# Patient Record
Sex: Male | Born: 1985 | Race: Black or African American | Hispanic: No | Marital: Single | Smoking: Current every day smoker
Health system: Southern US, Community
[De-identification: ages and names within clinical notes are randomized; demographics above are authoritative.]

## PROBLEM LIST (undated history)

## (undated) DIAGNOSIS — R7989 Other specified abnormal findings of blood chemistry: Secondary | ICD-10-CM

## (undated) DIAGNOSIS — Z720 Tobacco use: Secondary | ICD-10-CM

## (undated) DIAGNOSIS — M25569 Pain in unspecified knee: Secondary | ICD-10-CM

## (undated) DIAGNOSIS — I214 Non-ST elevation (NSTEMI) myocardial infarction: Secondary | ICD-10-CM

## (undated) DIAGNOSIS — I471 Supraventricular tachycardia, unspecified: Secondary | ICD-10-CM

## (undated) DIAGNOSIS — R079 Chest pain, unspecified: Secondary | ICD-10-CM

## (undated) DIAGNOSIS — R002 Palpitations: Secondary | ICD-10-CM

## (undated) HISTORY — PX: KNEE SURGERY: SHX244

---
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
## 2007-03-30 ENCOUNTER — Ambulatory Visit: Payer: Self-pay | Admitting: Orthopedic Surgery

## 2008-02-05 ENCOUNTER — Emergency Department (HOSPITAL_COMMUNITY): Admission: EM | Admit: 2008-02-05 | Discharge: 2008-02-05 | Payer: Self-pay | Admitting: Emergency Medicine

## 2008-02-17 ENCOUNTER — Emergency Department (HOSPITAL_COMMUNITY): Admission: EM | Admit: 2008-02-17 | Discharge: 2008-02-17 | Payer: Self-pay | Admitting: Emergency Medicine

## 2008-05-27 ENCOUNTER — Emergency Department (HOSPITAL_COMMUNITY): Admission: EM | Admit: 2008-05-27 | Discharge: 2008-05-27 | Payer: Self-pay | Admitting: Emergency Medicine

## 2008-10-30 ENCOUNTER — Encounter: Payer: Self-pay | Admitting: Orthopedic Surgery

## 2008-10-30 ENCOUNTER — Ambulatory Visit (HOSPITAL_COMMUNITY): Admission: RE | Admit: 2008-10-30 | Discharge: 2008-10-30 | Payer: Self-pay | Admitting: Family Medicine

## 2008-12-02 ENCOUNTER — Ambulatory Visit: Payer: Self-pay | Admitting: Orthopedic Surgery

## 2008-12-02 DIAGNOSIS — M25569 Pain in unspecified knee: Secondary | ICD-10-CM

## 2008-12-02 HISTORY — DX: Pain in unspecified knee: M25.569

## 2008-12-03 ENCOUNTER — Telehealth: Payer: Self-pay | Admitting: Orthopedic Surgery

## 2009-05-01 ENCOUNTER — Ambulatory Visit (HOSPITAL_COMMUNITY): Admission: RE | Admit: 2009-05-01 | Discharge: 2009-05-01 | Payer: Self-pay | Admitting: Orthopaedic Surgery

## 2009-05-13 ENCOUNTER — Encounter (HOSPITAL_COMMUNITY): Admission: RE | Admit: 2009-05-13 | Discharge: 2009-06-12 | Payer: Self-pay | Admitting: Orthopaedic Surgery

## 2010-12-04 NOTE — Op Note (Signed)
NAME:  Alan Curtis NO.:  [id number]   MEDICAL RECORD NO.:  [id number]          PATIENT TYPE:  AMB   LOCATION:  DAY                           FACILITY:  [facility]   PHYSICIAN:  Vickki Hearing, M.D.DATE OF BIRTH:  [birth year]   DATE OF PROCEDURE:  06/28/2006  DATE OF DISCHARGE:                               OPERATIVE REPORT   HISTORY:  Alan Curtis was injured in October 2007 playing basketball.  This was a non-contact injury.  The initial evaluation was done by Dr.  Hilda Lias.  An MRI was done. The patient had an anterior cruciate ligament  tear.  Since Dr. Hilda Lias does not do reconstructions, he was referred to  me for consultation through his primary care physician, Dr. Sudie Bailey.  His primary complaint was that his knee does not feel right. Physical  exam confirmed his Lachman test as positive. His MRI confirmed him to  have an isolated ACL tear with bone bruises and, based on his age and  activity level, it was recommended that he have an ACL reconstruction.  He gave informed consent for the same and we scheduled the surgery.   PREOPERATIVE DIAGNOSIS:  Anterior cruciate ligament tear, right knee.   POSTOPERATIVE DIAGNOSIS:  Anterior cruciate ligament tear, right knee.   PROCEDURE:  Anterior cruciate ligament reconstruction with a patellar  tendon autograft.   IMPLANTS USED:  Two bio-absorbable screws, 7 x 23 and 11 x 28.   SURGEON:  Vickki Hearing, M.D.   ASSISTANTSherrill Raring.   ANESTHESIA:  General.   OPERATIVE FINDINGS:  Under anesthesia, he had full range of motion. The  collateral ligaments were stable.  PCL was normal.  ACL was torn  evidenced by a positive Lachman. Grade of Lachman test was 2.  The pivot  shift was positive grade 1.   DETAILS:  This patient was identified in the preop holding area as  Alan Curtis.  His right knee was marked for surgery.  I counter-signed  it. History and physical was updated.  On the way back to  surgery, his  antibiotics were started.  We used Ancef.  He was taken to the operating  room for general anesthetic after which he had exam under anesthesia.  The time-out procedure was completed after the prep and drape.   The tourniquet was elevated to 300 mmHg. The knee was then injected with  30 mL of Marcaine with epinephrine.  A straight incision was made just  medial to the midline of the right knee.  The subcutaneous tissue was  divided down to the extensor mechanism.  The paratenon was divided and  the tendon was exposed medially and laterally. The central third of the  tendon was incised and two bone blocks were removed, one proximal and  one distal, using an oscillating saw.   The tendon was prepared on the back table, measured a length of 95, had  20 mm and 27 mm bone blocks, two drill holes were placed, one in each  bone block, sutures were passed through the drill holes and the graft  was placed  on a tensioner.   The arthroscopic portion of the procedure was then started.  We did a  diagnostic arthroscopy and palpated and visualized the structures of the  knee.  The menisci were normal.  The ACL was torn. The patellofemoral  joint was normal.  The cartilaginous surfaces of the knee were normal.   The notch was prepared by removing the torn ACL, performing a  notchplasty. The over-the-top position was identified visually and with  the probe.  The tibial guide was set for 55 degrees. Using a 7-mm offset  right ACL guide, we placed a guide pin in the joint centered in the  notch just off the medial tibial spine and posterior to the lateral  meniscal posterior edge. This was over reamed with a 11-mm reamer.  The  over-the-top guide was placed at a 7-mm offset.  The drill pin was  drilled out the anterolateral thigh.  This pin was over reamed with a 10-  mm reamer and the posterior wall integrity was confirmed. The graft was  then passed, secured proximally with the 7 x 23  screw.  After cycling  the knee several times, the knee was placed in extension and the distal  screw was placed. Lachman test confirmed stability of the knee.  The  scope was placed back in the knee and the new ligament was visually  inspected in flexion and extension and, with the probe, found to have an  excellent appearance and good tension.   The knee was irrigated and then we closed.  Closure was done by grafting  patellar tendon defect with autograft from the bone plugs. The patellar  tendon was closed with 0 Monocryl, 2-0 Monocryl was used to close the  subcu tissue, staples were used to close the skin. An additional 30 mL  of Marcaine with epinephrine was injected into the joint with a spinal  needle. A sterile dressing, Ace bandage, and Cryo/Cuff were applied to  the knee along with a brace locked in extension.  The patient was  extubated and taken to recovery room in stable condition.  The  postoperative plan is for full weight bearing in the brace with  crutches.  Follow-up in two days, therapy in three.      Vickki Hearing, M.D.  Electronically Signed     SEH/MEDQ  D:  06/28/2006  T:  06/28/2006  Job:  [id number]

## 2010-12-04 NOTE — H&P (Signed)
NAME:  Alan Curtis, XIANG NO.:  [id number]   MEDICAL RECORD NO.:  [id number]          PATIENT TYPE:  AMB   LOCATION:  DAY                           FACILITY:  [facility]   PHYSICIAN:  Vickki Hearing, M.D.DATE OF BIRTH:  1985-10-13   DATE OF ADMISSION:  DATE OF DISCHARGE:  LH                              HISTORY & PHYSICAL   CHIEF COMPLAINT:  Right knee pain and swelling.   HISTORY:  This is a 25 year old male who injured his knee in October  2007, when he was playing basketball and his knee went one way, his body  went the other.  His initial evaluation was done by Dr. Hilda Lias who  ordered an MRI and found the patient to have anterior cruciate ligament  tear.  Dr. Hilda Lias does not do anterior cruciate ligament  reconstructions, and he was referred to me for consultation through Dr.  Sudie Bailey.  He does not complain of pain at this time, but he still has  swelling in his knee does not feel right.  He does have an ache in the  knee, and his MRI does show that he has a torn anterior cruciate  ligament.  His MRI was done on April 22, 2006 at [facility].  There were no meniscal tears.  There was a bone contusion on the lateral  femoral condyle, lateral tibial plateau.   I have given the patient the treatment options for this type of injury  in his age group.  His growth plates are closed, and he is a good  candidate for an ACL reconstruction with an autograft.   We evaluated all 10 systems by history and they were all normal.   ALLERGIES:  NONE KNOWN.   MEDICAL PROBLEMS:  None.   SURGERIES:  None.   MEDICATIONS:  None.   FAMILY HISTORY:  Negative.   SOCIAL HISTORY:  Negative.  He is in the 10th grade.  Dr. Sudie Bailey is  his family physician.   PHYSICAL EXAMINATION:  VITAL SIGNS:  His weight is 140, pulse 86.  Respiratory rate is 18.  GENERAL APPEARANCE:  He had normal development, grooming, nutrition and  hygiene.  Body habitus was ectomorphic.  HEAD, EYES, EARS, NOSE AND THROAT: Showed no lesions.  NECK:  Supple.  RESPIRATORY:  Breath sounds were equal.  LUNGS:  Expansion normal.  CARDIOVASCULAR:  Peripheral vascular system showed no swelling or  varicose veins.  Palpation of pulses normal.  Normal temperature.  No  edema or tenderness.  GI:  Abdomen was soft.  LYMPH NODES:  Were negative.  MUSCULOSKELETAL:  Findings were:  Gait and station were normal.  Inspection revealed small joint effusion, with some lateral compartment  tenderness.  Range of motion has returned to normal.  He does have  instability in the Lachman and pivot shift maneuvers.  His muscle  strength and tone is normal.  SKIN:  Intact.  Sensation is normal.  He is oriented x3.  Mood is  normal, reflexes are normal, and his coordination is excellent.   Again, radiographs indicate closed growth plates.  MRI shows torn  anterior cruciate ligament.   PLAN:  Is to reconstruct the ACL with patellar tendon autograft, right  knee.   DIAGNOSIS:  Right knee ACL tear.   Informed consent process completed with grandmother present.      Vickki Hearing, M.D.  Electronically Signed     SEH/MEDQ  D:  06/27/2006  T:  06/27/2006  Job:  [id number]   cc:   [facility]

## 2011-04-16 LAB — BASIC METABOLIC PANEL
Calcium: 9.4
GFR calc Af Amer: >60
GFR calc non Af Amer: >60
Potassium: 3.5
Sodium: 138

## 2011-04-16 LAB — DIFFERENTIAL
Basophils Absolute: 0
Eosinophils Relative: 0
Lymphocytes Relative: 11 — ABNORMAL LOW
Lymphs Abs: 1.5
Monocytes Absolute: 0.9
Neutro Abs: 11 — ABNORMAL HIGH

## 2011-04-16 LAB — CBC
HCT: 42.3
Hemoglobin: 14.1
RDW: 14.3
WBC: 13.5 — ABNORMAL HIGH

## 2013-05-11 ENCOUNTER — Encounter (HOSPITAL_COMMUNITY): Payer: Self-pay | Admitting: Emergency Medicine

## 2013-05-11 ENCOUNTER — Emergency Department (HOSPITAL_COMMUNITY)
Admission: EM | Admit: 2013-05-11 | Discharge: 2013-05-11 | Disposition: A | Discharge status: Home / Self Care | Payer: Self-pay | Attending: Emergency Medicine | Admitting: Emergency Medicine

## 2013-05-11 DIAGNOSIS — L02219 Cutaneous abscess of trunk, unspecified: Secondary | ICD-10-CM | POA: Insufficient documentation

## 2013-05-11 DIAGNOSIS — L02214 Cutaneous abscess of groin: Secondary | ICD-10-CM

## 2013-05-11 MED ORDER — LIDOCAINE HCL (PF) 2 % IJ SOLN
10.0000 mL | Freq: Once | INTRAMUSCULAR | Status: AC
  Administered 2013-05-11: 10 mL
  Filled 2013-05-11: qty 10

## 2013-05-11 NOTE — ED Notes (Signed)
MD at bedside for I+D

## 2013-05-11 NOTE — ED Notes (Signed)
Edematous area, ~3 cm x 1 cm, noted to pt's perineum between scrotum and thigh. No redness noted. Pain worsens upon movement.

## 2013-05-11 NOTE — ED Provider Notes (Signed)
CSN: [id number]     Arrival date & time 05/11/13  0022 History   First MD Initiated Contact with Patient 05/11/13 0102     Chief Complaint  Patient presents with  . Groin Swelling   (Consider location/radiation/quality/duration/timing/severity/associated sxs/prior Treatment) The history is provided by the patient.   27 year old male noted a knot in the right inguinal area about 3 days ago. It has been enlarging. It is moderately painful especially when walking. He denies fever, chills, sweats. Denies any trauma. He has not treated it with anything. He states the pain is mild but he cannot put a number on it.   History reviewed. No pertinent past medical history. History reviewed. No pertinent past surgical history. No family history on file. History  Substance Use Topics  . Smoking status: Never Smoker   . Smokeless tobacco: Not on file  . Alcohol Use: No    Review of Systems  All other systems reviewed and are negative.    Allergies  Tramadol-acetaminophen  Home Medications   Current Outpatient Rx  Name  Route  Sig  Dispense  Refill  . HYDROcodone-acetaminophen (NORCO) 7.5-325 MG per tablet   Oral   Take 1 tablet by mouth every 8 (eight) hours as needed for pain.          BP 122/77  Pulse 75  Temp(Src) 98.6 F (37 C) (Oral)  Resp 16  SpO2 99% Physical Exam  Nursing note and vitals reviewed.  27 year old male, resting comfortably and in no acute distress. Vital signs are normal. Oxygen saturation is 99%, which is normal. Head is normocephalic and atraumatic. PERRLA, EOMI. Oropharynx is clear. Neck is nontender and supple without adenopathy or JVD. Back is nontender and there is no CVA tenderness. Lungs are clear without rales, wheezes, or rhonchi. Chest is nontender. Heart has regular rate and rhythm without murmur. Abdomen is soft, flat, nontender without masses or hepatosplenomegaly and peristalsis is normoactive. Genitalia: Circumcised penis. Testes  descended. There is an abscess in the right posterior inguinal area with overlying erythema and fluctuance. The abscess measures about 2 cm x 1 cm. Extremities have no cyanosis or edema, full range of motion is present. Skin is warm and dry without rash. Neurologic: Mental status is normal, cranial nerves are intact, there are no motor or sensory deficits.  ED Course  Procedures (including critical care time) INCISION AND DRAINAGE Performed by: [email or web address] Consent: Verbal consent obtained. Risks and benefits: risks, benefits and alternatives were discussed Type: abscess  Body area: right groin  Anesthesia: local infiltration  Incision was made with a scalpel.  Local anesthetic: lidocaine 2% without epinephrine  Anesthetic total: 2 ml  Complexity: complex Blunt dissection to break up loculations  Drainage: purulent  Drainage amount: moderate  Packing material: none  Patient tolerance: Patient tolerated the procedure well with no immediate complications.   MDM   1. Abscess of right groin    Right inguinal abscess which will need incision and drainage.    Dione Booze, MD 05/11/13 [phone]

## 2013-05-11 NOTE — ED Notes (Signed)
Pt states he noticed a "knot" in right groin a couple of days ago, painful to touch, denies other complaints

## 2015-08-20 ENCOUNTER — Telehealth: Payer: Self-pay | Admitting: Orthopaedic Surgery

## 2015-08-21 MED ORDER — HYDROCODONE-ACETAMINOPHEN 7.5-325 MG PO TABS: 1.0000 | ORAL_TABLET | ORAL | Status: DC | PRN

## 2015-08-21 NOTE — Telephone Encounter (Signed)
Rx printed

## 2015-09-09 ENCOUNTER — Emergency Department (HOSPITAL_COMMUNITY): Payer: Self-pay

## 2015-09-09 ENCOUNTER — Observation Stay (HOSPITAL_COMMUNITY)
Admission: EM | Admit: 2015-09-09 | Discharge: 2015-09-11 | Disposition: A | Discharge status: Home / Self Care | Payer: Self-pay | Attending: Internal Medicine | Admitting: Internal Medicine

## 2015-09-09 ENCOUNTER — Encounter (HOSPITAL_COMMUNITY): Payer: Self-pay

## 2015-09-09 DIAGNOSIS — Z720 Tobacco use: Secondary | ICD-10-CM | POA: Diagnosis present

## 2015-09-09 DIAGNOSIS — R0789 Other chest pain: Principal | ICD-10-CM | POA: Insufficient documentation

## 2015-09-09 DIAGNOSIS — R079 Chest pain, unspecified: Secondary | ICD-10-CM | POA: Diagnosis present

## 2015-09-09 DIAGNOSIS — R7989 Other specified abnormal findings of blood chemistry: Secondary | ICD-10-CM

## 2015-09-09 DIAGNOSIS — I214 Non-ST elevation (NSTEMI) myocardial infarction: Secondary | ICD-10-CM | POA: Insufficient documentation

## 2015-09-09 DIAGNOSIS — Z87891 Personal history of nicotine dependence: Secondary | ICD-10-CM | POA: Insufficient documentation

## 2015-09-09 DIAGNOSIS — R778 Other specified abnormalities of plasma proteins: Secondary | ICD-10-CM | POA: Diagnosis present

## 2015-09-09 DIAGNOSIS — R002 Palpitations: Secondary | ICD-10-CM | POA: Diagnosis present

## 2015-09-09 DIAGNOSIS — J3489 Other specified disorders of nose and nasal sinuses: Secondary | ICD-10-CM | POA: Insufficient documentation

## 2015-09-09 HISTORY — DX: Non-ST elevation (NSTEMI) myocardial infarction: I21.4

## 2015-09-09 HISTORY — DX: Other specified abnormal findings of blood chemistry: R79.89

## 2015-09-09 HISTORY — DX: Tobacco use: Z72.0

## 2015-09-09 HISTORY — DX: Chest pain, unspecified: R07.9

## 2015-09-09 HISTORY — DX: Palpitations: R00.2

## 2015-09-09 HISTORY — DX: Other specified abnormalities of plasma proteins: R77.8

## 2015-09-09 LAB — TROPONIN I
TROPONIN I: 0.04 ng/mL — AB (ref <0.031)
TROPONIN I: 0.08 ng/mL — AB (ref <0.031)
TROPONIN I: 0.11 ng/mL — AB (ref <0.031)

## 2015-09-09 LAB — URINALYSIS, ROUTINE W REFLEX MICROSCOPIC
Bilirubin Urine: NEGATIVE
GLUCOSE, UA: NEGATIVE mg/dL
KETONES UR: 15 mg/dL — AB
LEUKOCYTES UA: NEGATIVE
Nitrite: NEGATIVE
PH: 7 (ref 5.0–8.0)
Protein, ur: 100 mg/dL — AB
Specific Gravity, Urine: 1.015 (ref 1.005–1.030)

## 2015-09-09 LAB — RAPID URINE DRUG SCREEN, HOSP PERFORMED
Amphetamines: NOT DETECTED
BARBITURATES: NOT DETECTED
Benzodiazepines: NOT DETECTED
COCAINE: NOT DETECTED
Opiates: POSITIVE — AB
TETRAHYDROCANNABINOL: POSITIVE — AB

## 2015-09-09 LAB — COMPREHENSIVE METABOLIC PANEL
ALBUMIN: 4.3 g/dL (ref 3.5–5.0)
ALK PHOS: 78 U/L (ref 38–126)
ALT: 9 U/L — AB (ref 17–63)
AST: 16 U/L (ref 15–41)
Anion gap: 9 (ref 5–15)
BILIRUBIN TOTAL: 0.5 mg/dL (ref 0.3–1.2)
BUN: 9 mg/dL (ref 6–20)
CALCIUM: 9.3 mg/dL (ref 8.9–10.3)
CO2: 27 mmol/L (ref 22–32)
CREATININE: 1.02 mg/dL (ref 0.61–1.24)
Chloride: 107 mmol/L (ref 101–111)
GFR calc Af Amer: >60 mL/min (ref >60)
GLUCOSE: 104 mg/dL — AB (ref 65–99)
POTASSIUM: 3.7 mmol/L (ref 3.5–5.1)
Sodium: 143 mmol/L (ref 135–145)
TOTAL PROTEIN: 8 g/dL (ref 6.5–8.1)

## 2015-09-09 LAB — CBC WITH DIFFERENTIAL/PLATELET
BASOS ABS: 0 10*3/uL (ref 0.0–0.1)
BASOS PCT: 0 %
Eosinophils Absolute: 0 10*3/uL (ref 0.0–0.7)
Eosinophils Relative: 0 %
HEMATOCRIT: 41.7 % (ref 39.0–52.0)
HEMOGLOBIN: 13.9 g/dL (ref 13.0–17.0)
LYMPHS PCT: 7 %
Lymphs Abs: 1.1 10*3/uL (ref 0.7–4.0)
MCH: 30.2 pg (ref 26.0–34.0)
MCHC: 33.3 g/dL (ref 30.0–36.0)
MCV: 90.7 fL (ref 78.0–100.0)
MONO ABS: 0.8 10*3/uL (ref 0.1–1.0)
Monocytes Relative: 5 %
NEUTROS ABS: 13.6 10*3/uL — AB (ref 1.7–7.7)
NEUTROS PCT: 88 %
Platelets: 291 10*3/uL (ref 150–400)
RBC: 4.6 MIL/uL (ref 4.22–5.81)
RDW: 14.3 % (ref 11.5–15.5)
WBC: 15.6 10*3/uL — ABNORMAL HIGH (ref 4.0–10.5)

## 2015-09-09 LAB — URINE MICROSCOPIC-ADD ON

## 2015-09-09 LAB — D-DIMER, QUANTITATIVE: D-Dimer, Quant: <0.27 ug/mL-FEU (ref 0.00–0.50)

## 2015-09-09 MED ORDER — ACETAMINOPHEN 325 MG PO TABS
650.0000 mg | ORAL_TABLET | Freq: Four times a day (QID) | ORAL | Status: DC | PRN
  Administered 2015-09-10: 650 mg via ORAL
  Filled 2015-09-09: qty 2

## 2015-09-09 MED ORDER — SODIUM CHLORIDE 0.9% FLUSH: 3.0000 mL | INTRAVENOUS | Status: DC | PRN

## 2015-09-09 MED ORDER — SODIUM CHLORIDE 0.9% FLUSH
3.0000 mL | Freq: Two times a day (BID) | INTRAVENOUS | Status: DC
  Administered 2015-09-10 – 2015-09-11 (×2): 3 mL via INTRAVENOUS

## 2015-09-09 MED ORDER — OXYCODONE HCL 5 MG PO TABS: 5.0000 mg | ORAL_TABLET | ORAL | Status: DC | PRN

## 2015-09-09 MED ORDER — ONDANSETRON HCL 4 MG/2ML IJ SOLN: 4.0000 mg | Freq: Four times a day (QID) | INTRAMUSCULAR | Status: DC | PRN

## 2015-09-09 MED ORDER — SODIUM CHLORIDE 0.9 % IV SOLN: 250.0000 mL | INTRAVENOUS | Status: DC | PRN

## 2015-09-09 MED ORDER — NICOTINE 14 MG/24HR TD PT24
14.0000 mg | MEDICATED_PATCH | Freq: Every day | TRANSDERMAL | Status: DC
  Administered 2015-09-09 – 2015-09-10 (×2): 14 mg via TRANSDERMAL
  Filled 2015-09-09 (×2): qty 1

## 2015-09-09 MED ORDER — NITROGLYCERIN 2 % TD OINT
0.5000 [in_us] | TOPICAL_OINTMENT | Freq: Four times a day (QID) | TRANSDERMAL | Status: DC
  Administered 2015-09-09 – 2015-09-10 (×3): 0.5 [in_us] via TOPICAL
  Filled 2015-09-09 (×3): qty 1

## 2015-09-09 MED ORDER — ASPIRIN 81 MG PO CHEW
324.0000 mg | CHEWABLE_TABLET | Freq: Once | ORAL | Status: AC
  Administered 2015-09-09: 324 mg via ORAL
  Filled 2015-09-09: qty 4

## 2015-09-09 MED ORDER — ALUM & MAG HYDROXIDE-SIMETH 200-200-20 MG/5ML PO SUSP: 30.0000 mL | Freq: Four times a day (QID) | ORAL | Status: DC | PRN

## 2015-09-09 MED ORDER — ONDANSETRON HCL 4 MG PO TABS: 4.0000 mg | ORAL_TABLET | Freq: Four times a day (QID) | ORAL | Status: DC | PRN

## 2015-09-09 MED ORDER — ENOXAPARIN SODIUM 40 MG/0.4ML ~~LOC~~ SOLN
40.0000 mg | SUBCUTANEOUS | Status: DC
  Filled 2015-09-09: qty 0.4

## 2015-09-09 MED ORDER — HYDROMORPHONE HCL 1 MG/ML IJ SOLN: 0.5000 mg | INTRAMUSCULAR | Status: DC | PRN

## 2015-09-09 MED ORDER — ACETAMINOPHEN 650 MG RE SUPP
650.0000 mg | Freq: Four times a day (QID) | RECTAL | Status: DC | PRN
Start: 2015-09-09 — End: 2015-09-11

## 2015-09-09 MED ORDER — ASPIRIN 325 MG PO TABS
325.0000 mg | ORAL_TABLET | Freq: Every day | ORAL | Status: DC
  Administered 2015-09-10 – 2015-09-11 (×2): 325 mg via ORAL
  Filled 2015-09-09 (×2): qty 1

## 2015-09-09 NOTE — H&P (Signed)
[facility] Admission History and Physical       Alan Curtis:[id number] DOB: 1986-05-27 DOA: 09/09/2015  Referring physician: EDP PCP: Milana Obey, MD  Specialists:   Chief Complaint: Left Sided Chest Pain  HPI: Alan Curtis is a 30 y.o. male previously healthy who presents to the ED with complaints of awakening with left sided chest pain, described as sharp, and rated at a 4/10 associated with SOB, Palpitations, and Diaphoresis.   He reports that the discomfort l;asted about 1.5 hours.  He reports having similar symptoms back in the summer which was associated with drinking Energy drinks.   He denies any recent consumption of energy drinks or high caffeine drinks.  He was seen in the ED and evaluated and was found to have a Troponin of 0.04, and the next troponin was found at 0.08.  A D-dimer was performed and was negative, and a Chest X-ray was with acute findings.  A UDS was positive for THC and opiates.   He was referred for further evaluation.       Review of Systems:  Constitutional: No Weight Loss, No Weight Gain, Night Sweats, Fevers, Chills, Dizziness, Light Headedness, Fatigue, or Generalized Weakness HEENT: No Headaches, Difficulty Swallowing,Tooth/Dental Problems,Sore Throat,  No Sneezing, Rhinitis, Ear Ache, Nasal Congestion, or Post Nasal Drip,  Cardio-vascular:  +Chest pain, Orthopnea, PND, Edema in Lower Extremities, Anasarca, Dizziness, +Palpitations  Resp:  +Dyspnea, No DOE, No Productive Cough, No Non-Productive Cough, No Hemoptysis, No Wheezing.    GI: No Heartburn, Indigestion, Abdominal Pain, Nausea, Vomiting, Diarrhea, Constipation, Hematemesis, Hematochezia, Melena, Change in Bowel Habits,  Loss of Appetite  GU: No Dysuria, No Change in Color of Urine, No Urgency or Urinary Frequency, No Flank pain.  Musculoskeletal: No Joint Pain or Swelling, No Decreased Range of Motion, No Back Pain.  Neurologic: No Syncope, No Seizures, Muscle Weakness,  Paresthesia, Vision Disturbance or Loss, No Diplopia, No Vertigo, No Difficulty Walking,  Skin: No Rash or Lesions. Psych: No Change in Mood or Affect, No Depression or Anxiety, No Memory loss, No Confusion, or Hallucinations   History reviewed. No pertinent past medical history.   Past Surgical History  Procedure Laterality Date  . Knee surgery        Prior to Admission medications   Medication Sig Start Date End Date Taking? Authorizing Provider  HYDROcodone-acetaminophen (NORCO) 7.5-325 MG tablet Take 1 tablet by mouth every 4 (four) hours as needed. Patient taking differently: Take 1 tablet by mouth every 4 (four) hours as needed for moderate pain (shoulder pain).  08/21/15  Yes Darreld Mclean, MD     No Active Allergies  Social History:  reports that he has quit smoking. He does not have any smokeless tobacco history on file. He reports that he does not drink alcohol or use illicit drugs.    Family History:    Maternal Grandmother with HTN, and DM2   Physical Exam:  GEN:  Pleasant Thin 30 y.o. African American male examined and in no acute distress; cooperative with exam Filed Vitals:   09/09/15 1700 09/09/15 1730 09/09/15 1800 09/09/15 1830  BP: 130/89 119/84 116/81 116/80  Pulse: 76 72 77 77  Temp:      TempSrc:      Resp: Height:      Weight:      SpO2: 99% 98% 97% 95%   Blood pressure 116/80, pulse 77, temperature 98.4 F (36.9 C), temperature source Tympanic, resp. rate 21, height   (1.727 m), weight 65.772 kg (145 lb), SpO2 95 %. PSYCH: He is alert and oriented x4; does not appear anxious does not appear depressed; affect is normal HEENT: Normocephalic and Atraumatic, Mucous membranes pink; PERRLA; EOM intact; Fundi:  Benign;  No scleral icterus, Nares: Patent, Oropharynx: Clear, Fair Dentition,    Neck:  FROM, No Cervical Lymphadenopathy nor Thyromegaly or Carotid Bruit; No JVD; Breasts:: Not examined CHEST WALL: No tenderness CHEST: Normal  respiration, clear to auscultation bilaterally HEART: Regular rate and rhythm; no murmurs rubs or gallops BACK: No kyphosis or scoliosis; No CVA tenderness ABDOMEN: Positive Bowel Sounds, Scaphoid, Soft Non-Tender, No Rebound or Guarding; No Masses, No Organomegaly Rectal Exam: Not done EXTREMITIES: No Cyanosis, Clubbing, or Edema; No Ulcerations. Genitalia: not examined PULSES: 2+ and symmetric SKIN: Normal hydration no rash or ulceration CNS:  Alert and Oriented x 4, No Focal Deficits Vascular: pulses palpable throughout    Labs on Admission:  Basic Metabolic Panel:  Recent Labs Lab 09/09/15 1526  NA 143  K 3.7  CL 107  CO2 27  GLUCOSE 104*  BUN 9  CREATININE 1.02  CALCIUM 9.3   Liver Function Tests:  Recent Labs Lab 09/09/15 1526  AST 16  ALT 9*  ALKPHOS 78  BILITOT 0.5  PROT 8.0  ALBUMIN 4.3   No results for input(s): LIPASE, AMYLASE in the last 168 hours. No results for input(s): AMMONIA in the last 168 hours. CBC:  Recent Labs Lab 09/09/15 1526  WBC 15.6*  NEUTROABS 13.6*  HGB 13.9  HCT 41.7  MCV 90.7  PLT 291   Cardiac Enzymes:  Recent Labs Lab 09/09/15 1526 09/09/15 1733  TROPONINI 0.04* 0.08*    BNP (last 3 results) No results for input(s): BNP in the last 8760 hours.  ProBNP (last 3 results) No results for input(s): PROBNP in the last 8760 hours.  CBG: No results for input(s): GLUCAP in the last 168 hours.  Radiological Exams on Admission: Dg Chest 2 View  09/09/2015  CLINICAL DATA:  Diaphoresis, heart racing, left-sided chest pain EXAM: CHEST  2 VIEW COMPARISON:  None. FINDINGS: The heart size and mediastinal contours are within normal limits. Both lungs are clear. The visualized skeletal structures are unremarkable. IMPRESSION: No active cardiopulmonary disease. Electronically Signed   By: Esperanza Heir M.D.   On: 09/09/2015 17:01     EKG: Independently reviewed. Normal Sinus Rhythm rate = 88 No Acute S-T  changes     Assessment/Plan:      30 y.o. male with  Principal Problem:    1.    Chest pain    Cardiac Monitoring    Cycle Troponins    ASA, Nitropaste, O2    Check Fasting Lipids in AM    2D ECHO in AM  Active Problems:    2.    Elevated troponin- Rule out ACS    Cycle Troponins     3.    Palpitations    Check TSH    4.    Tobacco Abuse    Nicotine pactch    5.    Marijuana Use    Counselled    6.    DVT Prophylaxis    Lovenox      Code Status:     FULL CODE     Family Communication:   No Family Present    Disposition Plan:      Observation Status        Time spent:  109 Minutes  Ron Parker [facility] Pager [phone]   If 7AM -7PM Please Contact the Day Rounding Team MD for [facility]  If 7PM-7AM, Please Contact Night-Floor Coverage  [email or web address] Password [facility] 09/09/2015, 6:51 PM     ADDENDUM:   Patient was seen and examined on 09/09/2015

## 2015-09-09 NOTE — ED Notes (Signed)
Pt reports woke up approx 45 min ago and sat on the side of the bed.  Reports while sitting there he became diaphoretic and felt like heart was racing.  Pt says is having some left sided chest pain but doesn't feel like heart is racing.

## 2015-09-09 NOTE — ED Provider Notes (Signed)
CSN: [id number]     Arrival date & time 09/09/15  1357 History   First MD Initiated Contact with Patient 09/09/15 1457     Chief Complaint  Patient presents with  . Tachycardia     (Consider location/radiation/quality/duration/timing/severity/associated sxs/prior Treatment) HPI Comments: Patient states he woke from sleep around 1 PM with left-sided chest pain as well as diaphoresis and racing heart and palpitations. Symptoms he stopped after about 45 minutes. States he had a similar episode last summer he was drinking energy drinks that he has not had any since then. He is a smoker but denies any illicit drug use. Pain subsided on its own and he feels back to normal now. He states he felt fine when he went to bed. There is no cough or fever. He has had some rhinorrhea. No leg pain or leg swelling. No abdominal pain, nausea or vomiting. No history of heart or lung problems. No recent long car trips or plane trips. Denies any excessive caffeine use.  The history is provided by the patient.    History reviewed. No pertinent past medical history. Past Surgical History  Procedure Laterality Date  . Knee surgery     No family history on file. Social History  Substance Use Topics  . Smoking status: Former Games developer  . Smokeless tobacco: None  . Alcohol Use: No    Review of Systems  Constitutional: Negative for fever and activity change.  HENT: Positive for congestion and rhinorrhea.   Respiratory: Positive for chest tightness and shortness of breath.   Cardiovascular: Negative for chest pain and leg swelling.  Gastrointestinal: Negative for nausea, vomiting and abdominal pain.  Genitourinary: Negative for dysuria, hematuria and testicular pain.  Musculoskeletal: Negative for myalgias and arthralgias.  Skin: Negative for rash.  Neurological: Negative for dizziness, weakness, light-headedness and headaches.  A complete 10 system review of systems was obtained and all systems are negative  except as noted in the HPI and PMH.      Allergies  Review of patient's allergies indicates no active allergies.  Home Medications   Prior to Admission medications   Medication Sig Start Date End Date Taking? Authorizing Provider  HYDROcodone-acetaminophen (NORCO) 7.5-325 MG tablet Take 1 tablet by mouth every 4 (four) hours as needed. Patient taking differently: Take 1 tablet by mouth every 4 (four) hours as needed for moderate pain (shoulder pain).  08/21/15  Yes Darreld Mclean, MD   BP 116/80 mmHg  Pulse 77  Temp(Src) 98.4 F (36.9 C) (Tympanic)  Resp 21  Ht  (1.727 m)  Wt 145 lb (65.772 kg)  BMI 22.05 kg/m2  SpO2 95% Physical Exam  Constitutional: He is oriented to person, place, and time. He appears well-developed and well-nourished. No distress.  HENT:  Head: Normocephalic and atraumatic.  Mouth/Throat: Oropharynx is clear and moist. No oropharyngeal exudate.  Eyes: Conjunctivae and EOM are normal. Pupils are equal, round, and reactive to light.  Neck: Normal range of motion. Neck supple.  No meningismus.  Cardiovascular: Normal rate, regular rhythm, normal heart sounds and intact distal pulses.   No murmur heard. Pulmonary/Chest: Effort normal and breath sounds normal. No respiratory distress. He exhibits no tenderness.  Abdominal: Soft. There is no tenderness. There is no rebound and no guarding.  Musculoskeletal: Normal range of motion. He exhibits no edema or tenderness.  Neurological: He is alert and oriented to person, place, and time. No cranial nerve deficit. He exhibits normal muscle tone. Coordination normal.  No ataxia on finger  to nose bilaterally. No pronator drift. 5/5 strength throughout. CN 2-12 intact.Equal grip strength. Sensation intact.   Skin: Skin is warm.  Psychiatric: He has a normal mood and affect. His behavior is normal.  Nursing note and vitals reviewed.   ED Course  Procedures (including critical care time) Labs Review Labs Reviewed   CBC WITH DIFFERENTIAL/PLATELET - Abnormal; Notable for the following:    WBC 15.6 (*)    Neutro Abs 13.6 (*)    All other components within normal limits  COMPREHENSIVE METABOLIC PANEL - Abnormal; Notable for the following:    Glucose, Bld 104 (*)    ALT 9 (*)    All other components within normal limits  TROPONIN I - Abnormal; Notable for the following:    Troponin I 0.04 (*)    All other components within normal limits  URINE RAPID DRUG SCREEN, HOSP PERFORMED - Abnormal; Notable for the following:    Opiates POSITIVE (*)    Tetrahydrocannabinol POSITIVE (*)    All other components within normal limits  URINALYSIS, ROUTINE W REFLEX MICROSCOPIC (NOT AT [facility]) - Abnormal; Notable for the following:    Hgb urine dipstick TRACE (*)    Ketones, ur 15 (*)    Protein, ur 100 (*)    All other components within normal limits  URINE MICROSCOPIC-ADD ON - Abnormal; Notable for the following:    Squamous Epithelial / LPF 0-5 (*)    Bacteria, UA FEW (*)    Casts GRANULAR CAST (*)    All other components within normal limits  TROPONIN I - Abnormal; Notable for the following:    Troponin I 0.08 (*)    All other components within normal limits  D-DIMER, QUANTITATIVE (NOT AT [facility])    Imaging Review Dg Chest 2 View  09/09/2015  CLINICAL DATA:  Diaphoresis, heart racing, left-sided chest pain EXAM: CHEST  2 VIEW COMPARISON:  None. FINDINGS: The heart size and mediastinal contours are within normal limits. Both lungs are clear. The visualized skeletal structures are unremarkable. IMPRESSION: No active cardiopulmonary disease. Electronically Signed   By: Esperanza Heir M.D.   On: 09/09/2015 17:01   I have personally reviewed and evaluated these images and lab results as part of my medical decision-making.   EKG Interpretation   Date/Time:  Tuesday September 09 2015 17:32:58 EST Ventricular Rate:  88 PR Interval:  119 QRS Duration: 85 QT Interval:  379 QTC Calculation: 458 R Axis:   81 Text  Interpretation:  Sinus rhythm Borderline short PR interval No  significant change was found Confirmed by Manus Gunning  MD, Mataio Mele [phone]) on  09/09/2015 5:36:36 PM      MDM   Final diagnoses:  NSTEMI (non-ST elevated myocardial infarction) (HCC)  Chest pain, unspecified chest pain type   Episode of racing heart and left sided chest pain now resolved. Feels back to baseline. Regular rate and rhythm with normal EKG. Lungs are clear.  Chest xray negative. D-dimer negative. Troponin minimally elevated 0.04  Drug screen positive for THC and opiates, negative for cocaine.  No further episodes of chest pain in the ED. Discussed with Dr. Lovell Sheehan of hospitalist service. She recommends repeat troponin. Low suspicion for ACS. Heart score 1.  Second troponin has increased to 0.08. EKG is unchanged. No further episodes of chest pain. Suspect this may related to his drug use. Patient needs admission given his rising troponins. Aspirin given. Discussed with Dr. Lovell Sheehan.  Glynn Octave, MD 09/09/15 [phone]

## 2015-09-10 ENCOUNTER — Observation Stay (HOSPITAL_BASED_OUTPATIENT_CLINIC_OR_DEPARTMENT_OTHER): Payer: Self-pay

## 2015-09-10 DIAGNOSIS — R079 Chest pain, unspecified: Secondary | ICD-10-CM | POA: Insufficient documentation

## 2015-09-10 LAB — CBC
HCT: 38.7 % — ABNORMAL LOW (ref 39.0–52.0)
Hemoglobin: 13 g/dL (ref 13.0–17.0)
MCH: 30.4 pg (ref 26.0–34.0)
MCHC: 33.6 g/dL (ref 30.0–36.0)
MCV: 90.4 fL (ref 78.0–100.0)
PLATELETS: 281 10*3/uL (ref 150–400)
RBC: 4.28 MIL/uL (ref 4.22–5.81)
RDW: 14.1 % (ref 11.5–15.5)
WBC: 13.1 10*3/uL — AB (ref 4.0–10.5)

## 2015-09-10 LAB — LIPID PANEL
CHOLESTEROL: 122 mg/dL (ref 0–200)
HDL: 38 mg/dL — AB (ref >40)
LDL CALC: 68 mg/dL (ref 0–99)
TRIGLYCERIDES: 82 mg/dL (ref <150)
Total CHOL/HDL Ratio: 3.2 RATIO
VLDL: 16 mg/dL (ref 0–40)

## 2015-09-10 LAB — BASIC METABOLIC PANEL
ANION GAP: 8 (ref 5–15)
BUN: 10 mg/dL (ref 6–20)
CALCIUM: 8.9 mg/dL (ref 8.9–10.3)
CHLORIDE: 105 mmol/L (ref 101–111)
CO2: 28 mmol/L (ref 22–32)
Creatinine, Ser: 1.02 mg/dL (ref 0.61–1.24)
GFR calc Af Amer: >60 mL/min (ref >60)
GLUCOSE: 110 mg/dL — AB (ref 65–99)
POTASSIUM: 3.2 mmol/L — AB (ref 3.5–5.1)
Sodium: 141 mmol/L (ref 135–145)

## 2015-09-10 LAB — TROPONIN I
TROPONIN I: 0.05 ng/mL — AB (ref <0.031)
Troponin I: 0.08 ng/mL — ABNORMAL HIGH (ref <0.031)

## 2015-09-10 MED ORDER — POTASSIUM CHLORIDE CRYS ER 20 MEQ PO TBCR
40.0000 meq | EXTENDED_RELEASE_TABLET | Freq: Once | ORAL | Status: AC
  Administered 2015-09-10: 40 meq via ORAL
  Filled 2015-09-10: qty 2

## 2015-09-10 NOTE — Progress Notes (Signed)
[facility] PROGRESS NOTE  Alan Curtis ZHY:[id number] DOB: [birth year] DOA: 09/09/2015 PCP: Milana Obey, MD  Assessment/Plan: 1. Chest pain with elevated troponin. Etiology is not entirely clear.  EKG is non-acute. Troponin peaked at 0.11 and has trended down. ECHO is in process. He has not had any further chest pain since admission. D dimer negative. Urine drug screen negative for cocaine. Will request cardiology input to see if stress testing is needed. Keep npo after midnight  2. Palpitations, resolved. 3. Tobacco abuse, counseled on the importance of cessation.  4. Marijuana use, counseled on the importance of cessation.   Code Status: Full DVT prophylaxis: Lovenox Family Communication: no family present atbedside Disposition Plan: anticipate discharge, possibly today, pending cardiology input   Consultants:    Procedures:   ECHO, results pending  Antibiotics:   none  HPI/Subjective: Doing well. Is not having anymore CP, and has not experienced any since he got to hospital. He first noticed pain when he sat up in bed. Pain did not radiate, just stayed in chest. Pain resolved on its own. Patient reports that he was able to get into the shower, and a cold shower helped, but pain worsened afterwards. Denies any pain during palpation. Reports that past experience of chest pain was related to drinking energy drinks, stopped, and has not had any recently. No family history of early heart attacks but patient does smoke. Denies HTN. Denies any drug abuse.  Objective: Filed Vitals:   09/10/15 0210 09/10/15 0600  BP: 100/58 120/70  Pulse: 86 81  Temp: 98.5 F (36.9 C) 98.2 F (36.8 C)  Resp: 20 20   No intake or output data in the 24 hours ending 09/10/15 1158 Filed Weights   09/09/15 1405  Weight: 65.772 kg (145 lb)    Exam:  General: NAD, looks comfortable Cardiovascular: RRR, S1, S2  Respiratory: clear bilaterally, No wheezing, rales or  rhonchi Abdomen: soft, non tender, no distention , bowel sounds normal Musculoskeletal: No edema b/l  Data Reviewed: Basic Metabolic Panel:  Recent Labs Lab 09/09/15 1526 09/10/15 0206  NA 143 141  K 3.7 3.2*  CL 107 105  CO2 27 28  GLUCOSE 104* 110*  BUN 9 10  CREATININE 1.02 1.02  CALCIUM 9.3 8.9   Liver Function Tests:  Recent Labs Lab 09/09/15 1526  AST 16  ALT 9*  ALKPHOS 78  BILITOT 0.5  PROT 8.0  ALBUMIN 4.3    CBC:  Recent Labs Lab 09/09/15 1526 09/10/15 0206  WBC 15.6* 13.1*  NEUTROABS 13.6*  --   HGB 13.9 13.0  HCT 41.7 38.7*  MCV 90.7 90.4  PLT 291 281   Cardiac Enzymes:  Recent Labs Lab 09/09/15 1526 09/09/15 1733 09/09/15 2023 09/10/15 0206 09/10/15 0857  TROPONINI 0.04* 0.08* 0.11* 0.08* 0.05*      Studies: Dg Chest 2 View  09/09/2015  CLINICAL DATA:  Diaphoresis, heart racing, left-sided chest pain EXAM: CHEST  2 VIEW COMPARISON:  None. FINDINGS: The heart size and mediastinal contours are within normal limits. Both lungs are clear. The visualized skeletal structures are unremarkable. IMPRESSION: No active cardiopulmonary disease. Electronically Signed   By: Esperanza Heir M.D.   On: 09/09/2015 17:01    Scheduled Meds: . aspirin  325 mg Oral Daily  . enoxaparin (LOVENOX) injection  40 mg Subcutaneous Q24H  . nicotine  14 mg Transdermal Daily  . nitroGLYCERIN  0.5 inch Topical 4 times per day  . sodium chloride flush  3 mL Intravenous  Q12H   Continuous Infusions:   Principal Problem:   Chest pain Active Problems:   Elevated troponin   Palpitations   Tobacco abuse  Time spent: 25 minutes  Chesky Heyer. MD  [facility] Pager [phone]. If 7PM-7AM, please contact night-coverage at [email or web address], password [facility] 09/10/2015, 11:58 AM

## 2015-09-10 NOTE — Care Management Note (Signed)
Case Management Note  Patient Details  Name: Alan Curtis MRN: [id number] Date of Birth: January 22, 1986  Subjective/Objective:                  Pt admitted with CP. Pt is from home and ind with ADL's. Pt is employed has no insurance but does have PCP. Pt has been referred to the [facility] for fincial assistance.   Action/Plan: No CM needs anticipated.   Expected Discharge Date:     09/10/2015             Expected Discharge Plan:  Home/Self Care  In-House Referral:  Financial Counselor  Discharge planning Services  CM Consult  Post Acute Care Choice:  NA Choice offered to:  NA  DME Arranged:    DME Agency:     HH Arranged:    HH Agency:     Status of Service:  Completed, signed off  Medicare Important Message Given:    Date Medicare IM Given:    Medicare IM give by:    Date Additional Medicare IM Given:    Additional Medicare Important Message give by:     If discussed at Long Length of Stay Meetings, dates discussed:    Additional Comments:  Malcolm Metro, RN 09/10/2015, 2:52 PM

## 2015-09-11 ENCOUNTER — Encounter (HOSPITAL_COMMUNITY): Payer: Self-pay | Admitting: Adult Health

## 2015-09-11 DIAGNOSIS — Z720 Tobacco use: Secondary | ICD-10-CM

## 2015-09-11 DIAGNOSIS — R7989 Other specified abnormal findings of blood chemistry: Secondary | ICD-10-CM

## 2015-09-11 DIAGNOSIS — R079 Chest pain, unspecified: Secondary | ICD-10-CM

## 2015-09-11 DIAGNOSIS — R002 Palpitations: Secondary | ICD-10-CM

## 2015-09-11 LAB — TSH: TSH: 0.718 u[IU]/mL (ref 0.350–4.500)

## 2015-09-11 NOTE — Discharge Summary (Signed)
Physician Discharge Summary  Alan Curtis JXB:[id number] DOB: [birth year] DOA: 09/09/2015  PCP: Milana Obey, MD  Admit date: 09/09/2015 Discharge date: 09/11/2015  Time spent: 35 minutes  Recommendations for Outpatient Follow-up:  1. Follow up with PCP in 1-2 weeks.   Discharge Diagnoses:  Principal Problem:   Chest pain Active Problems:   Elevated troponin   Palpitations   Tobacco abuse   Pain in the chest   Discharge Condition: Improved   Diet recommendation: Heart healthy   Filed Weights   09/09/15 1405  Weight: 65.772 kg (145 lb)    History of present illness:  30 year old male presented with complaints with sudden onset, sharp chest pain. He also complained of associated SOB, palpitations, and diaphoresis. The chest pain were similar to past which were found to be due to high consumption of energy drinks/caffiene. While in the ED found to have troponin of .04 and next .08. D-dimer was negative and CXR without acute findings. UDS positive for opiates and THC. He was admitted for further evaluations.   Hospital Course:  Patient was admitted for chest pain with elevated troponin. Etiology is not entirely clear. EKG is non-acute. Troponin peaked at 0.11 and has trended down. ECHO results below. He has not had any further chest pain since admission. D dimer negative. Urine drug screen negative for cocaine. Cardiology consulted no further workup needed at this time. If symptoms recur then further ischemic workup can be pursued at that time.  1. Palpitations, resolved. 2. Tobacco abuse, counseled on the importance of cessation.  3. Marijuana use, counseled on the importance of cessation  Procedures:  ECHO Study Conclusions  - Left ventricle: The cavity size was normal. Wall thickness was normal. Systolic function was normal. The estimated ejection fraction was in the range of 55% to 60%. Wall motion was normal; there were no regional wall motion  abnormalities. Left ventricular diastolic function parameters were normal. - Mitral valve: There was trivial regurgitation. - Right atrium: Central venous pressure (est): 3 mm Hg. - Atrial septum: No defect or patent foramen ovale was identified. - Tricuspid valve: There was trivial regurgitation. - Pulmonary arteries: Systolic pressure could not be accurately estimated. - Pericardium, extracardiac: There was no pericardial effusion.Study Conclusions  Consultations:  Cardiology   Discharge Exam: Filed Vitals:   09/11/15 0100 09/11/15 0600  BP: 133/86 138/79  Pulse: 67 75  Temp: 98.4 F (36.9 C) 98.6 F (37 C)  Resp: 18 18    General: NAD, looks comfortable Cardiovascular: RRR, S1, S2  Respiratory: clear bilaterally, No wheezing, rales or rhonchi Abdomen: soft, non tender, no distention , bowel sounds normal Musculoskeletal: No edema b/l  Discharge Instructions    Current Discharge Medication List    CONTINUE these medications which have NOT CHANGED   Details  HYDROcodone-acetaminophen (NORCO) 7.5-325 MG tablet Take 1 tablet by mouth every 4 (four) hours as needed. Qty: 120 tablet, Refills: 0       No Known Allergies    The results of significant diagnostics from this hospitalization (including imaging, microbiology, ancillary and laboratory) are listed below for reference.    Significant Diagnostic Studies: Dg Chest 2 View  09/09/2015  CLINICAL DATA:  Diaphoresis, heart racing, left-sided chest pain EXAM: CHEST  2 VIEW COMPARISON:  None. FINDINGS: The heart size and mediastinal contours are within normal limits. Both lungs are clear. The visualized skeletal structures are unremarkable. IMPRESSION: No active cardiopulmonary disease. Electronically Signed   By: Edgar Frisk.D.  On: 09/09/2015 17:01    Microbiology: No results found for this or any previous visit (from the past 240 hour(s)).   Labs: Basic Metabolic Panel:  Recent Labs Lab  09/09/15 1526 09/10/15 0206  NA 143 141  K 3.7 3.2*  CL 107 105  CO2 27 28  GLUCOSE 104* 110*  BUN 9 10  CREATININE 1.02 1.02  CALCIUM 9.3 8.9   Liver Function Tests:  Recent Labs Lab 09/09/15 1526  AST 16  ALT 9*  ALKPHOS 78  BILITOT 0.5  PROT 8.0  ALBUMIN 4.3   CBC:  Recent Labs Lab 09/09/15 1526 09/10/15 0206  WBC 15.6* 13.1*  NEUTROABS 13.6*  --   HGB 13.9 13.0  HCT 41.7 38.7*  MCV 90.7 90.4  PLT 291 281   Cardiac Enzymes:  Recent Labs Lab 09/09/15 1526 09/09/15 1733 09/09/15 2023 09/10/15 0206 09/10/15 0857  TROPONINI 0.04* 0.08* 0.11* 0.08* 0.05*    Signed:  Erick Blinks, MD   [facility] 09/11/2015, 10:45 AM    By signing my name below, I, Zadie Cleverly, attest that this documentation has been prepared under the direction and in the presence of Erick Blinks, MD. Electronically signed: Zadie Cleverly, Scribe. 09/11/2015 12:19pm   I, Dr. Erick Blinks, personally performed the services described in this documentaiton. All medical record entries made by the scribe were at my direction and in my presence. I have reviewed the chart and agree that the record reflects my personal performance and is accurate and complete  Erick Blinks, MD, 09/11/2015 12:36 PM

## 2015-09-11 NOTE — Consult Note (Signed)
CARDIOLOGY CONSULT NOTE   Patient ID: Alan Curtis MRN: [id number] DOB/AGE: 1985-12-06 30 y.o.  Admit Date: 09/09/2015 Referring Physician: Evlyn Clines MD Primary Physician: Milana Obey, MD Consulting Cardiologist: Prentice Docker MD Primary Cardiologist: New Reason for Consultation: Chest Pain with elevated troponin   Clinical Summary Alan Curtis is a 29 y.o.male admitted for observation after experiencing rapid HR upon awakening 2 mornings ago. He states that he became sweaty and felt lightheaded with blurred vision. On the way to ER,he states he sniffed and the HR normalized. He also felt some left sided chest discomfort but no significant pain.  He states he had this happen about a year ago, but he was drinking a lot of energy drinks. He stopped doing this and had no other issues until the day of admission.   On arrival to ER, BP 111/69, HR 87, O2 Sat 100%, afebrile. Troponin elevated at 0.08 and 0.08 respectively. He was found to have elevated WBC at 15.6, Potassium 3.7, follow up potassium 09/10/2015 3.2 and was replaced.  Creatinine 1.02. UDS positive for opiates and THC. CXR negative for CHF or pneumonia. EKG negative for WPW or Brugata's syndrome. He was treated with ASA 81 mg and NTG paste.   Since admission, he had had some abdominal pain but no further chest pain or rapid HR.   No Known Allergies  Medications Scheduled Medications: . aspirin  325 mg Oral Daily  . enoxaparin (LOVENOX) injection  40 mg Subcutaneous Q24H  . nicotine  14 mg Transdermal Daily  . sodium chloride flush  3 mL Intravenous Q12H    Infusions:    PRN Medications: sodium chloride, acetaminophen **OR** acetaminophen, alum & mag hydroxide-simeth, HYDROmorphone (DILAUDID) injection, ondansetron **OR** ondansetron (ZOFRAN) IV, oxyCODONE, sodium chloride flush   History reviewed. No pertinent past medical history. He states he does not know his father, and his mother is in good health.    Past Surgical History  Procedure Laterality Date  . Knee surgery      History reviewed. No pertinent family history.  Social History Alan Curtis reports that he has been smoking Cigarettes.  He has been smoking about 1.00 pack per day. He does not have any smokeless tobacco history on file. Alan Curtis reports that he does not drink alcohol.  Review of Systems Complete review of systems are found to be negative unless outlined in H&P above.  Physical Examination Blood pressure 138/79, pulse 75, temperature 98.6 F (37 C), temperature source Oral, resp. rate 18, height  (1.727 m), weight 145 lb (65.772 kg), SpO2 100 %.  Intake/Output Summary (Last 24 hours) at 09/11/15 1118 Last data filed at 09/11/15 0954  Gross per 24 hour  Intake    480 ml  Output      0 ml  Net    480 ml    Telemetry: NSR.   GEN: No acute distress.  HEENT: Conjunctiva and lids normal, oropharynx clear with moist mucosa. Neck: Supple, no elevated JVP or carotid bruits, no thyromegaly. Lungs: Clear to auscultation, nonlabored breathing at rest. Cardiac: Regular rate and rhythm, no S3 or significant systolic murmur, no pericardial rub. Abdomen: Soft, nontender, no hepatomegaly, bowel sounds present, no guarding or rebound. Extremities: No pitting edema, distal pulses 2+. Skin: Warm and dry. Musculoskeletal: No kyphosis. Neuropsychiatric: Alert and oriented x3, affect grossly appropriate.  Prior Cardiac Testing/Procedures 1. Echocardiogram 09/10/2015 Left ventricle: The cavity size was normal. Wall thickness was normal. Systolic function was normal. The estimated ejection fraction was  in the range of 55% to 60%. Wall motion was normal; there were no regional wall motion abnormalities. Left ventricular diastolic function parameters were normal. - Mitral valve: There was trivial regurgitation. - Right atrium: Central venous pressure (est): 3 mm Hg. - Atrial septum: No defect or patent  foramen ovale was identified. - Tricuspid valve: There was trivial regurgitation. - Pulmonary arteries: Systolic pressure could not be accurately estimated. - Pericardium, extracardiac: There was no pericardial effusion. Impressions:  Normal LV wall thickness with LVEF 55-60% and normal diastolic function. Trivial mitral and tricuspid regurgitation.  Lab Results  Basic Metabolic Panel:  Recent Labs Lab 09/09/15 1526 09/10/15 0206  NA 143 141  K 3.7 3.2*  CL 107 105  CO2 27 28  GLUCOSE 104* 110*  BUN 9 10  CREATININE 1.02 1.02  CALCIUM 9.3 8.9    Liver Function Tests:  Recent Labs Lab 09/09/15 1526  AST 16  ALT 9*  ALKPHOS 78  BILITOT 0.5  PROT 8.0  ALBUMIN 4.3    CBC:  Recent Labs Lab 09/09/15 1526 09/10/15 0206  WBC 15.6* 13.1*  NEUTROABS 13.6*  --   HGB 13.9 13.0  HCT 41.7 38.7*  MCV 90.7 90.4  PLT 291 281    Cardiac Enzymes:  Recent Labs Lab 09/09/15 1526 09/09/15 1733 09/09/15 2023 09/10/15 0206 09/10/15 0857  TROPONINI 0.04* 0.08* 0.11* 0.08* 0.05*    BNP: Invalid input(s): POCBNP   Radiology: Dg Chest 2 View  09/09/2015  CLINICAL DATA:  Diaphoresis, heart racing, left-sided chest pain EXAM: CHEST  2 VIEW COMPARISON:  None. FINDINGS: The heart size and mediastinal contours are within normal limits. Both lungs are clear. The visualized skeletal structures are unremarkable. IMPRESSION: No active cardiopulmonary disease. Electronically Signed   By: Esperanza Heir M.D.   On: 09/09/2015 17:01     ECG: NSR without evidence of WPW or ST abnormalities.    Impression and Recommendations  1. Rapid HR: Unknown etiology. Normal echo, without evidence of PFO or LV dysfunction. Elevated troponin may be from rapid HR but there has been no evidence of this since admission. Valsalva maneuver, sniffing, caused normalization of HR before arriving to ER. Can consider OP cardiac monitor. He will have follow up appointment made. Check TSH before  discharge.   2. Marijuana and tobacco abuse: Cessation is recommended.   Can go home from cardiology standpoint.   Signed: Bettey Mare. Lawrence NP [facility]  09/11/2015, 11:18 AM   The patient was seen and examined, and I agree with the assessment and plan as documented above. Case discussed in detail with Harriet Pho NP. No further episodes of chest pain nor palpitations. Echocardiogram entirely normal, as was ECG. Can be discharged with outpatient follow up and cardiac monitoring consideration if demonstrated recurrences.   Prentice Docker, MD, [facility]  09/11/2015 11:39 AM   Co-Sign MD

## 2015-09-18 ENCOUNTER — Telehealth: Payer: Self-pay | Admitting: Orthopaedic Surgery

## 2015-09-18 MED ORDER — HYDROCODONE-ACETAMINOPHEN 7.5-325 MG PO TABS: 1.0000 | ORAL_TABLET | ORAL | Status: DC | PRN

## 2015-09-18 NOTE — Telephone Encounter (Signed)
Rx printed

## 2015-09-18 NOTE — Telephone Encounter (Signed)
Patient is requesting refill on Hydrocodone 7.5/325mg   Qty 120 Tablets

## 2015-10-16 ENCOUNTER — Telehealth: Payer: Self-pay | Admitting: Orthopaedic Surgery

## 2015-10-16 MED ORDER — HYDROCODONE-ACETAMINOPHEN 7.5-325 MG PO TABS: 1.0000 | ORAL_TABLET | ORAL | Status: DC | PRN

## 2015-10-16 NOTE — Telephone Encounter (Signed)
Patient called for refill of medication: HYDROcodone-acetaminophen (NORCO) 7.5-325 MG tablet [[id number]][[id number]] - quantity 120.  His updated ph# is [phone]

## 2015-10-16 NOTE — Telephone Encounter (Signed)
Rx done. 

## 2015-10-17 ENCOUNTER — Emergency Department (HOSPITAL_COMMUNITY): Payer: Self-pay

## 2015-10-17 ENCOUNTER — Emergency Department (HOSPITAL_COMMUNITY)
Admission: EM | Admit: 2015-10-17 | Discharge: 2015-10-17 | Disposition: A | Discharge status: Home / Self Care | Payer: Self-pay | Attending: Emergency Medicine | Admitting: Emergency Medicine

## 2015-10-17 ENCOUNTER — Encounter (HOSPITAL_COMMUNITY): Payer: Self-pay | Admitting: *Deleted

## 2015-10-17 DIAGNOSIS — R000 Tachycardia, unspecified: Secondary | ICD-10-CM | POA: Insufficient documentation

## 2015-10-17 DIAGNOSIS — F17210 Nicotine dependence, cigarettes, uncomplicated: Secondary | ICD-10-CM | POA: Insufficient documentation

## 2015-10-17 DIAGNOSIS — R002 Palpitations: Secondary | ICD-10-CM | POA: Insufficient documentation

## 2015-10-17 DIAGNOSIS — Z79899 Other long term (current) drug therapy: Secondary | ICD-10-CM | POA: Insufficient documentation

## 2015-10-17 DIAGNOSIS — I2510 Atherosclerotic heart disease of native coronary artery without angina pectoris: Secondary | ICD-10-CM | POA: Insufficient documentation

## 2015-10-17 DIAGNOSIS — R42 Dizziness and giddiness: Secondary | ICD-10-CM | POA: Insufficient documentation

## 2015-10-17 DIAGNOSIS — E876 Hypokalemia: Secondary | ICD-10-CM | POA: Insufficient documentation

## 2015-10-17 LAB — COMPREHENSIVE METABOLIC PANEL
ALK PHOS: 95 U/L (ref 38–126)
ALT: 20 U/L (ref 17–63)
ANION GAP: 12 (ref 5–15)
AST: 40 U/L (ref 15–41)
Albumin: 4.2 g/dL (ref 3.5–5.0)
BUN: 8 mg/dL (ref 6–20)
CALCIUM: 8.8 mg/dL — AB (ref 8.9–10.3)
CO2: 25 mmol/L (ref 22–32)
CREATININE: 1.42 mg/dL — AB (ref 0.61–1.24)
Chloride: 100 mmol/L — ABNORMAL LOW (ref 101–111)
Glucose, Bld: 153 mg/dL — ABNORMAL HIGH (ref 65–99)
Potassium: 3.1 mmol/L — ABNORMAL LOW (ref 3.5–5.1)
SODIUM: 137 mmol/L (ref 135–145)
Total Bilirubin: 0.8 mg/dL (ref 0.3–1.2)
Total Protein: 8.1 g/dL (ref 6.5–8.1)

## 2015-10-17 LAB — CBC
HEMATOCRIT: 40.8 % (ref 39.0–52.0)
HEMOGLOBIN: 14 g/dL (ref 13.0–17.0)
MCH: 30.6 pg (ref 26.0–34.0)
MCHC: 34.3 g/dL (ref 30.0–36.0)
MCV: 89.1 fL (ref 78.0–100.0)
Platelets: 357 10*3/uL (ref 150–400)
RBC: 4.58 MIL/uL (ref 4.22–5.81)
RDW: 14.6 % (ref 11.5–15.5)
WBC: 20 10*3/uL — ABNORMAL HIGH (ref 4.0–10.5)

## 2015-10-17 LAB — MAGNESIUM: MAGNESIUM: 1.9 mg/dL (ref 1.7–2.4)

## 2015-10-17 LAB — TROPONIN I: TROPONIN I: 0.14 ng/mL — AB (ref <0.031)

## 2015-10-17 MED ORDER — POTASSIUM CHLORIDE CRYS ER 20 MEQ PO TBCR
40.0000 meq | EXTENDED_RELEASE_TABLET | Freq: Once | ORAL | Status: AC
  Administered 2015-10-17: 40 meq via ORAL
  Filled 2015-10-17: qty 2

## 2015-10-17 MED ORDER — POTASSIUM CHLORIDE CRYS ER 20 MEQ PO TBCR: 20.0000 meq | EXTENDED_RELEASE_TABLET | Freq: Two times a day (BID) | ORAL | Status: DC

## 2015-10-17 MED ORDER — SODIUM CHLORIDE 0.9 % IV BOLUS (SEPSIS)
1000.0000 mL | Freq: Once | INTRAVENOUS | Status: AC
  Administered 2015-10-17: 1000 mL via INTRAVENOUS

## 2015-10-17 NOTE — ED Notes (Signed)
Pt was in bed, sit up to smoke & started having left side chest pains. Pt states pain is better at this time.

## 2015-10-17 NOTE — ED Provider Notes (Signed)
CSN: [id number]     Arrival date & time 10/17/15  0012 History  By signing my name below, I, [facility] Center[facility]arr[facility] Curtis, attest that this documentation has been prepared under the direction and in the presence of Devoria AlbeIva Burnis Kaser, MD at 0045 AM. Electronically Signed: Randell PatientMarrissa Curtis, ED Scribe. 10/17/2015. 7:03 AM.   Chief Complaint  Patient presents with  . Chest Pain   The history is provided by the patient. No language interpreter was used.   HPI Comments: Alan DanceWalter A Curtis is a 30 y.o. male who presents to the Emergency Department complaining of completely resolved, tight, left-sided CP onset about 2345. Patient reports that he was sitting in bed smoking a cigarette when pain began He endorses SOB, mild diaphoresis, racing heart palpitations that lasted 15 minutes until he arrived in the ED, lightheadedness and dizziness that is also resolved. He has taken  Hydrocodone that he has for chronic knee and shoulder pain. He is a current 1 ppd smoker. He notes similar symptoms once in the past but states that symptoms were not as severe tonight during this episode. Denies an family hx of heart problems. Denies nausea, vomiting.  PCP Dr Kandis [facility] Dr Hilda LiasKeeling  Past Medical History  Diagnosis Date  . Coronary artery disease    Past Surgical History  Procedure Laterality Date  . Knee surgery     No family history on file. Social History  Substance Use Topics  . Smoking status: Current Every Day Smoker -- 1.00 packs/day    Types: Cigarettes  . Smokeless tobacco: None  . Alcohol Use: No  unemployed   Review of Systems  Constitutional: Positive for diaphoresis.  Respiratory: Positive for shortness of breath.   Cardiovascular: Positive for chest pain and palpitations.  Gastrointestinal: Negative for nausea and vomiting.  Neurological: Positive for dizziness and light-headedness.  All other systems reviewed and are negative.     Allergies  Review of patient's allergies indicates no known  allergies.  Home Medications   Prior to Admission medications   Medication Sig Start Date End Date Taking? Authorizing Provider  HYDROcodone-acetaminophen (NORCO) 7.5-325 MG tablet Take 1 tablet by mouth every 4 (four) hours as needed. 10/16/15  Yes Darreld McleanWayne Keeling, MD  potassium chloride SA (K-DUR,KLOR-CON) 20 MEQ tablet Take 1 tablet (20 mEq total) by mouth 2 (two) times daily. 10/17/15   Devoria AlbeIva Maliq Pilley, MD   BP 95/73 mmHg  Pulse 70  Temp(Src) 97.6 F (36.4 C) (Oral)  Resp 16  Ht 5\' 8"  (1.727 m)  Wt 140 lb (63.504 kg)  BMI 21.29 kg/m2  SpO2 100%  Vital signs normal Borderline blood pressure  Physical Exam  Constitutional: He is oriented to person, place, and time. He appears well-developed and well-nourished.  Non-toxic appearance. He does not appear ill. No distress.  HENT:  Head: Normocephalic and atraumatic.  Right Ear: External ear normal.  Left Ear: External ear normal.  Nose: Nose normal. No mucosal edema or rhinorrhea.  Mouth/Throat: Oropharynx is clear and moist and mucous membranes are normal. No dental abscesses or uvula swelling.  Eyes: Conjunctivae and EOM are normal. Pupils are equal, round, and reactive to light.  Neck: Normal range of motion and full passive range of motion without pain. Neck supple.  Cardiovascular: Normal rate, regular rhythm and normal heart sounds.  Exam reveals no gallop and no friction rub.   No murmur heard. Pulmonary/Chest: Effort normal and breath sounds normal. No respiratory distress. He has no wheezes. He has no rhonchi. He has no rales.  He exhibits no tenderness and no crepitus.  Abdominal: Soft. Normal appearance and bowel sounds are normal. He exhibits no distension. There is no tenderness. There is no rebound and no guarding.  Musculoskeletal: Normal range of motion. He exhibits no edema or tenderness.  Moves all extremities well.   Neurological: He is alert and oriented to person, place, and time. He has normal strength. No cranial nerve  deficit.  Skin: Skin is warm, dry and intact. No rash noted. No erythema. No pallor.  Psychiatric: He has a normal mood and affect. His speech is normal and behavior is normal. His mood appears not anxious.  Nursing note and vitals reviewed.   ED Course  Procedures   DIAGNOSTIC STUDIES: Oxygen Saturation is 100% on RA, normal by my interpretation.    COORDINATION OF CARE: 12:48 AM Will order chest x-ray and labs. Discussed treatment plan with pt at bedside and pt agreed to plan.  Patient was rechecked at 3 AM. He continues to feel fine. He has been in normal sinus rhythm on the monitor. We discussed getting a second troponin.  Patient's second troponin is positive, we are going to get a third troponin.  Patient was evaluated by cardiology, Dr. Beulah Gandy on February 23. At that time he felt he did have some mildly positive troponins that was probably from his tachycardia however the tachycardia was never captured. It had resolved prior to coming to the emergency department. He had echocardiogram done which showed his ejection fraction was 55-60% with trivial TR and MR and normal diastolic function. The cardiologist felt that if he had further symptoms he might need outpatient cardiac monitoring.   PT was discussed with Dr Anne Fu, cardiology he is not concerned that the troponin became positive in the situation where he had tachycardia it is expected. He feels patient can be discharged home. He is going to leave a message for the electrophysiological cardiologist to contact him to arrange a outpatient monitoring, he wants his hypokalemia replaced with oral potassium as an outpatient. He was advised if he had the tachycardia again before he has to monitor placed he should either a meal he come to the emergency department or call EMS to get an EKG done to see what his arrhythmia is. Patient understands.  Labs Review Results for orders placed or performed during the hospital encounter of  10/17/15  CBC  Result Value Ref Range   WBC 20.0 (H) 4.0 - 10.5 K/uL   RBC 4.58 4.22 - 5.81 MIL/uL   Hemoglobin 14.0 13.0 - 17.0 g/dL   HCT 16.1 09.6 - 04.5 %   MCV 89.1 78.0 - 100.0 fL   MCH 30.6 26.0 - 34.0 pg   MCHC 34.3 30.0 - 36.0 g/dL   RDW 40.9 81.1 - 91.4 %   Platelets 357 150 - 400 K/uL  Troponin I  Result Value Ref Range   Troponin I <0.03 <0.031 ng/mL  Comprehensive metabolic panel  Result Value Ref Range   Sodium 137 135 - 145 mmol/L   Potassium 3.1 (L) 3.5 - 5.1 mmol/L   Chloride 100 (L) 101 - 111 mmol/L   CO2 25 22 - 32 mmol/L   Glucose, Bld 153 (H) 65 - 99 mg/dL   BUN 8 6 - 20 mg/dL   Creatinine, Ser 7.82 (H) 0.61 - 1.24 mg/dL   Calcium 8.8 (L) 8.9 - 10.3 mg/dL   Total Protein 8.1 6.5 - 8.1 g/dL   Albumin 4.2 3.5 - 5.0 g/dL   AST  40 15 - 41 U/L   ALT 20 17 - 63 U/L   Alkaline Phosphatase 95 38 - 126 U/L   Total Bilirubin 0.8 0.3 - 1.2 mg/dL   GFR calc non Af Amer >60 >60 mL/min   GFR calc Af Amer >60 >60 mL/min   Anion gap 12 5 - 15  Magnesium  Result Value Ref Range   Magnesium 1.9 1.7 - 2.4 mg/dL  Troponin I  Result Value Ref Range   Troponin I 0.14 (H) <0.031 ng/mL   Laboratory interpretation all normal except For hypokalemia, renal insufficiency, and his second delta troponin is positive, persistent leukocytosis     Imaging Review Dg Chest 2 View  10/17/2015  CLINICAL DATA:  30 year old male with chest pain EXAM: CHEST  2 VIEW COMPARISON:  Radiograph dated 09/09/2015 FINDINGS: The heart size and mediastinal contours are within normal limits. Both lungs are clear. The visualized skeletal structures are unremarkable. IMPRESSION: No active cardiopulmonary disease. Electronically Signed   By: Elgie Collard M.D.   On: 10/17/2015 01:25   I have personally reviewed and evaluated these images and lab results as part of my medical decision-making.   EKG Interpretation   Date/Time:  Friday October 17 2015 00:29:09 EDT Ventricular Rate:  76 PR  Interval:  121 QRS Duration: 103 QT Interval:  414 QTC Calculation: 465 R Axis:   69 Text Interpretation:  Sinus rhythm RSR' in V1 or V2, right VCD or RVH  Since last tracing 09 Sep 2015 Borderline ST depression, inferior lateral  leads Confirmed by Clarece Drzewiecki  MD-I, Josiephine Simao ([id number]) on 10/17/2015 12:42:57 AM      MDM   Final diagnoses:  Palpitations  Tachycardia  Hypokalemia   New Prescriptions   POTASSIUM CHLORIDE SA (K-DUR,KLOR-CON) 20 MEQ TABLET    Take 1 tablet (20 mEq total) by mouth 2 (two) times daily.    Plan discharge  Devoria Albe, MD, FACEP    I personally performed the services described in this documentation, which was scribed in my presence. The recorded information has been reviewed and considered.  Devoria Albe, MD, Concha Pyo, MD 10/17/15 [phone]

## 2015-10-17 NOTE — Discharge Instructions (Signed)
Take the potassium pills until gone. You should be hearing from the Cardiologist about getting a monitor placed that you wear all the time for a month to try to capture your heart when it is racing. If you have another episode before you get the monitor placed, either call EMS or come as soon as you have symptoms to the ED to get an EKG done to capture your racing heart, which will help the cardiologists decide what treatment you need.    Potassium Content of Foods Potassium is a mineral found in many foods and drinks. It helps keep fluids and minerals balanced in your body and affects how steadily your heart beats. Potassium also helps control your blood pressure and keep your muscles and nervous system healthy. Certain health conditions and medicines may change the balance of potassium in your body. When this happens, you can help balance your level of potassium through the foods that you do or do not eat. Your health care provider or dietitian may recommend an amount of potassium that you should have each day. The following lists of foods provide the amount of potassium (in parentheses) per serving in each item. HIGH IN POTASSIUM  The following foods and beverages have 200 mg or more of potassium per serving:  Apricots, 2 raw or 5 dry (200 mg).  Artichoke, 1 medium (345 mg).  Avocado, raw,  each (245 mg).  Banana, 1 medium (425 mg).  Beans, lima, or baked beans, canned,  cup (280 mg).  Beans, white, canned,  cup (595 mg).  Beef roast, 3 oz (320 mg).  Beef, ground, 3 oz (270 mg).  Beets, raw or cooked,  cup (260 mg).  Bran muffin, 2 oz (300 mg).  Broccoli,  cup (230 mg).  Brussels sprouts,  cup (250 mg).  Cantaloupe,  cup (215 mg).  Cereal, 100% bran,  cup (200-400 mg).  Cheeseburger, single, fast food, 1 each (225-400 mg).  Chicken, 3 oz (220 mg).  Clams, canned, 3 oz (535 mg).  Crab, 3 oz (225 mg).  Dates, 5 each (270 mg).  Dried beans and peas,  cup  (300-475 mg).  Figs, dried, 2 each (260 mg).  Fish: halibut, tuna, cod, snapper, 3 oz (480 mg).  Fish: salmon, haddock, swordfish, perch, 3 oz (300 mg).  Fish, tuna, canned 3 oz (200 mg).  [place] fries, fast food, 3 oz (470 mg).  Granola with fruit and nuts,  cup (200 mg).  Grapefruit juice,  cup (200 mg).  Greens, beet,  cup (655 mg).  Honeydew melon,  cup (200 mg).  Kale, raw, 1 cup (300 mg).  Kiwi, 1 medium (240 mg).  Kohlrabi, rutabaga, parsnips,  cup (280 mg).  Lentils,  cup (365 mg).  Mango, 1 each (325 mg).  Milk, chocolate, 1 cup (420 mg).  Milk: nonfat, low-fat, whole, buttermilk, 1 cup (350-380 mg).  Molasses, 1 Tbsp (295 mg).  Mushrooms,  cup (280) mg.  Nectarine, 1 each (275 mg).  Nuts: almonds, peanuts, hazelnuts, [place], cashew, mixed, 1 oz (200 mg).  Nuts, pistachios, 1 oz (295 mg).  Orange, 1 each (240 mg).  Orange juice,  cup (235 mg).  Papaya, medium,  fruit (390 mg).  Peanut butter, chunky, 2 Tbsp (240 mg).  Peanut butter, smooth, 2 Tbsp (210 mg).  Pear, 1 medium (200 mg).  Pomegranate, 1 whole (400 mg).  Pomegranate juice,  cup (215 mg).  Pork, 3 oz (350 mg).  Potato chips, salted, 1 oz (465 mg).  Potato,  baked with skin, 1 medium (925 mg).  Potatoes, boiled,  cup (255 mg).  Potatoes, mashed,  cup (330 mg).  Prune juice,  cup (370 mg).  Prunes, 5 each (305 mg).  Pudding, chocolate,  cup (230 mg).  Pumpkin, canned,  cup (250 mg).  Raisins, seedless,  cup (270 mg).  Seeds, sunflower or pumpkin, 1 oz (240 mg).  Soy milk, 1 cup (300 mg).  Spinach,  cup (420 mg).  Spinach, canned,  cup (370 mg).  Sweet potato, baked with skin, 1 medium (450 mg).  Swiss chard,  cup (480 mg).  Tomato or vegetable juice,  cup (275 mg).  Tomato sauce or puree,  cup (400-550 mg).  Tomato, raw, 1 medium (290 mg).  Tomatoes, canned,  cup (200-300 mg).  [place], 3 oz (250 mg).  Wheat germ, 1 oz (250  mg).  Winter squash,  cup (250 mg).  Yogurt, plain or fruited, 6 oz (260-435 mg).  Zucchini,  cup (220 mg). MODERATE IN POTASSIUM The following foods and beverages have 50-200 mg of potassium per serving:  Apple, 1 each (150 mg).  Apple juice,  cup (150 mg).  Applesauce,  cup (90 mg).  Apricot nectar,  cup (140 mg).  Asparagus, small spears,  cup or 6 spears (155 mg).  Bagel, cinnamon raisin, 1 each (130 mg).  Bagel, egg or plain, 4 in., 1 each (70 mg).  Beans, green,  cup (90 mg).  Beans, yellow,  cup (190 mg).  Beer, regular, 12 oz (100 mg).  Beets, canned,  cup (125 mg).  Blackberries,  cup (115 mg).  Blueberries,  cup (60 mg).  Bread, whole wheat, 1 slice (70 mg).  Broccoli, raw,  cup (145 mg).  Cabbage,  cup (150 mg).  Carrots, cooked or raw,  cup (180 mg).  Cauliflower, raw,  cup (150 mg).  Celery, raw,  cup (155 mg).  Cereal, bran flakes, cup (120-150 mg).  Cheese, cottage,  cup (110 mg).  Cherries, 10 each (150 mg).  Chocolate, 1 oz bar (165 mg).  Coffee, brewed 6 oz (90 mg).  Corn,  cup or 1 ear (195 mg).  Cucumbers,  cup (80 mg).  Egg, large, 1 each (60 mg).  Eggplant,  cup (60 mg).  Endive, raw, cup (80 mg).  English muffin, 1 each (65 mg).  Fish, orange roughy, 3 oz (150 mg).  Frankfurter, beef or pork, 1 each (75 mg).  Fruit cocktail,  cup (115 mg).  Grape juice,  cup (170 mg).  Grapefruit,  fruit (175 mg).  Grapes,  cup (155 mg).  Greens: kale, turnip, collard,  cup (110-150 mg).  Ice cream or frozen yogurt, chocolate,  cup (175 mg).  Ice cream or frozen yogurt, vanilla,  cup (120-150 mg).  Lemons, limes, 1 each (80 mg).  Lettuce, all types, 1 cup (100 mg).  Mixed vegetables,  cup (150 mg).  Mushrooms, raw,  cup (110 mg).  Nuts: walnuts, pecans, or macadamia, 1 oz (125 mg).  Oatmeal,  cup (80 mg).  Okra,  cup (110 mg).  Onions, raw,  cup (120 mg).  Peach, 1 each  (185 mg).  Peaches, canned,  cup (120 mg).  Pears, canned,  cup (120 mg).  Peas, green, frozen,  cup (90 mg).  Peppers, green,  cup (130 mg).  Peppers, red,  cup (160 mg).  Pineapple juice,  cup (165 mg).  Pineapple, fresh or canned,  cup (100 mg).  Plums, 1 each (105 mg).  Pudding, vanilla,  cup (150 mg).  Raspberries,  cup (90 mg).  Rhubarb,  cup (115 mg).  Rice, wild,  cup (80 mg).  Shrimp, 3 oz (155 mg).  Spinach, raw, 1 cup (170 mg).  Strawberries,  cup (125 mg).  Summer squash  cup (175-200 mg).  Swiss chard, raw, 1 cup (135 mg).  Tangerines, 1 each (140 mg).  Tea, brewed, 6 oz (65 mg).  Turnips,  cup (140 mg).  Watermelon,  cup (85 mg).  Wine, red, table, 5 oz (180 mg).  Wine, white, table, 5 oz (100 mg). LOW IN POTASSIUM The following foods and beverages have less than 50 mg of potassium per serving.  Bread, white, 1 slice (30 mg).  Carbonated beverages, 12 oz (less than 5 mg).  Cheese, 1 oz (20-30 mg).  Cranberries,  cup (45 mg).  Cranberry juice cocktail,  cup (20 mg).  Fats and oils, 1 Tbsp (less than 5 mg).  Hummus, 1 Tbsp (32 mg).  Nectar: papaya, mango, or pear,  cup (35 mg).  Rice, white or brown,  cup (50 mg).  Spaghetti or macaroni,  cup cooked (30 mg).  Tortilla, flour or corn, 1 each (50 mg).  Waffle, 4 in., 1 each (50 mg).  Water chestnuts,  cup (40 mg).   This information is not intended to replace advice given to you by your health care provider. Make sure you discuss any questions you have with your health care provider.   Document Released: 02/16/2005 Document Revised: 07/10/2013 Document Reviewed: 06/01/2013 Elsevier Interactive Patient Education 2016 Else[employer] Inc.  Holter Monitoring A Holter monitor is a small device that is used to detect abnormal heart rhythms. It clips to your clothing and is connected by wires to flat, sticky disks (electrodes) that attach to your chest. It is worn  continuously for 24-48 hours. HOME CARE INSTRUCTIONS  Wear your Holter monitor at all times, even while exercising and sleeping, for as long as directed by your health care provider.  Make sure that the Holter monitor is safely clipped to your clothing or close to your body as recommended by your health care provider.  Do not get the monitor or wires wet.  Do not put body lotion or moisturizer on your chest.  Keep your skin clean.  Keep a diary of your daily activities, such as walking and doing chores. If you feel that your heartbeat is abnormal or that your heart is fluttering or skipping a beat:  Record what you are doing when it happens.  Record what time of day the symptoms occur.  Return your Holter monitor as directed by your health care provider.  Keep all follow-up visits as directed by your health care provider. This is important. SEEK IMMEDIATE MEDICAL CARE IF:  You feel lightheaded or you faint.  You have trouble breathing.  You feel pain in your chest, upper arm, or jaw.  You feel sick to your stomach and your skin is pale, cool, or damp.  You heartbeat feels unusual or abnormal.   This information is not intended to replace advice given to you by your health care provider. Make sure you discuss any questions you have with your health care provider.   Document Released: 04/02/2004 Document Revised: 07/26/2014 Document Reviewed: 02/11/2014 Elsevier Interactive Patient Education 2016 [employer].  Hypokalemia Hypokalemia means that the amount of potassium in the blood is lower than normal.Potassium is a chemical, called an electrolyte, that helps regulate the amount of fluid in the body. It also stimulates  muscle contraction and helps nerves function properly.Most of the body's potassium is inside of cells, and only a very small amount is in the blood. Because the amount in the blood is so small, minor changes can be  life-threatening. CAUSES  Antibiotics.  Diarrhea or vomiting.  Using laxatives too much, which can cause diarrhea.  Chronic kidney disease.  Water pills (diuretics).  Eating disorders (bulimia).  Low magnesium level.  Sweating a lot. SIGNS AND SYMPTOMS  Weakness.  Constipation.  Fatigue.  Muscle cramps.  Mental confusion.  Skipped heartbeats or irregular heartbeat (palpitations).  Tingling or numbness. DIAGNOSIS  Your health care provider can diagnose hypokalemia with blood tests. In addition to checking your potassium level, your health care provider may also check other lab tests. TREATMENT Hypokalemia can be treated with potassium supplements taken by mouth or adjustments in your current medicines. If your potassium level is very low, you may need to get potassium through a vein (IV) and be monitored in the hospital. A diet high in potassium is also helpful. Foods high in potassium are:  Nuts, such as peanuts and pistachios.  Seeds, such as sunflower seeds and pumpkin seeds.  Peas, lentils, and lima beans.  Whole grain and bran cereals and breads.  Fresh fruit and vegetables, such as apricots, avocado, bananas, cantaloupe, kiwi, oranges, tomatoes, asparagus, and potatoes.  Orange and tomato juices.  Red meats.  Fruit yogurt. HOME CARE INSTRUCTIONS  Take all medicines as prescribed by your health care provider.  Maintain a healthy diet by including nutritious food, such as fruits, vegetables, nuts, whole grains, and lean meats.  If you are taking a laxative, be sure to follow the directions on the label. SEEK MEDICAL CARE IF:  Your weakness gets worse.  You feel your heart pounding or racing.  You are vomiting or having diarrhea.  You are diabetic and having trouble keeping your blood glucose in the normal range. SEEK IMMEDIATE MEDICAL CARE IF:  You have chest pain, shortness of breath, or dizziness.  You are vomiting or having diarrhea for  more than 2 days.  You faint. MAKE SURE YOU:   Understand these instructions.  Will watch your condition.  Will get help right away if you are not doing well or get worse.   This information is not intended to replace advice given to you by your health care provider. Make sure you discuss any questions you have with your health care provider.   Document Released: 07/05/2005 Document Revised: 07/26/2014 Document Reviewed: 01/05/2013 Elsevier Interactive Patient Education 2016 [employer].  Palpitations A palpitation is the feeling that your heartbeat is irregular. It may feel like your heart is fluttering or skipping a beat. It may also feel like your heart is beating faster than normal. This is usually not a serious problem. In some cases, you may need more medical tests. HOME CARE  Avoid:  Caffeine in coffee, tea, soft drinks, diet pills, and energy drinks.  Chocolate.  Alcohol.  Stop smoking if you smoke.  Reduce your stress and anxiety. Try:  A method that measures bodily functions so you can learn to control them (biofeedback).  Yoga.  Meditation.  Physical activity such as swimming, jogging, or walking.  Get plenty of rest and sleep. GET HELP IF:  Your fast or irregular heartbeat continues after 24 hours.  Your palpitations occur more often. GET HELP RIGHT AWAY IF:   You have chest pain.  You feel short of breath.  You have a very bad  headache.  You feel dizzy or pass out (faint). MAKE SURE YOU:   Understand these instructions.  Will watch your condition.  Will get help right away if you are not doing well or get worse.   This information is not intended to replace advice given to you by your health care provider. Make sure you discuss any questions you have with your health care provider.   Document Released: 04/13/2008 Document Revised: 07/26/2014 Document Reviewed: 09/03/2011 Elsevier Interactive Patient Education [employer]

## 2015-10-27 ENCOUNTER — Other Ambulatory Visit: Payer: Self-pay

## 2015-10-27 DIAGNOSIS — R002 Palpitations: Secondary | ICD-10-CM

## 2015-11-03 ENCOUNTER — Institutional Professional Consult (permissible substitution): Payer: Self-pay | Admitting: Cardiology

## 2015-11-04 ENCOUNTER — Emergency Department (HOSPITAL_COMMUNITY)
Admission: EM | Admit: 2015-11-04 | Discharge: 2015-11-04 | Disposition: A | Discharge status: Home / Self Care | Payer: Self-pay | Attending: Emergency Medicine | Admitting: Emergency Medicine

## 2015-11-04 DIAGNOSIS — I959 Hypotension, unspecified: Secondary | ICD-10-CM | POA: Insufficient documentation

## 2015-11-04 DIAGNOSIS — I471 Supraventricular tachycardia: Secondary | ICD-10-CM | POA: Insufficient documentation

## 2015-11-04 DIAGNOSIS — Z79899 Other long term (current) drug therapy: Secondary | ICD-10-CM | POA: Insufficient documentation

## 2015-11-04 DIAGNOSIS — I252 Old myocardial infarction: Secondary | ICD-10-CM | POA: Insufficient documentation

## 2015-11-04 DIAGNOSIS — F17210 Nicotine dependence, cigarettes, uncomplicated: Secondary | ICD-10-CM | POA: Insufficient documentation

## 2015-11-04 DIAGNOSIS — I2510 Atherosclerotic heart disease of native coronary artery without angina pectoris: Secondary | ICD-10-CM | POA: Insufficient documentation

## 2015-11-04 HISTORY — DX: Chest pain, unspecified: R07.9

## 2015-11-04 HISTORY — DX: Pain in unspecified knee: M25.569

## 2015-11-04 HISTORY — DX: Tobacco use: Z72.0

## 2015-11-04 HISTORY — DX: Palpitations: R00.2

## 2015-11-04 HISTORY — DX: Non-ST elevation (NSTEMI) myocardial infarction: I21.4

## 2015-11-04 HISTORY — DX: Other specified abnormal findings of blood chemistry: R79.89

## 2015-11-04 LAB — BASIC METABOLIC PANEL
ANION GAP: 10 (ref 5–15)
BUN: 15 mg/dL (ref 6–20)
CHLORIDE: 104 mmol/L (ref 101–111)
CO2: 26 mmol/L (ref 22–32)
Calcium: 9.3 mg/dL (ref 8.9–10.3)
Creatinine, Ser: 1.2 mg/dL (ref 0.61–1.24)
GFR calc non Af Amer: >60 mL/min (ref >60)
GLUCOSE: 107 mg/dL — AB (ref 65–99)
Potassium: 3.9 mmol/L (ref 3.5–5.1)
Sodium: 140 mmol/L (ref 135–145)

## 2015-11-04 LAB — CBC WITH DIFFERENTIAL/PLATELET
BASOS ABS: 0 10*3/uL (ref 0.0–0.1)
BASOS PCT: 0 %
Eosinophils Absolute: 0.1 10*3/uL (ref 0.0–0.7)
Eosinophils Relative: 1 %
HEMATOCRIT: 42 % (ref 39.0–52.0)
HEMOGLOBIN: 13.8 g/dL (ref 13.0–17.0)
LYMPHS PCT: 30 %
Lymphs Abs: 3.3 10*3/uL (ref 0.7–4.0)
MCH: 29.3 pg (ref 26.0–34.0)
MCHC: 32.9 g/dL (ref 30.0–36.0)
MCV: 89.2 fL (ref 78.0–100.0)
MONO ABS: 0.7 10*3/uL (ref 0.1–1.0)
MONOS PCT: 7 %
NEUTROS ABS: 6.9 10*3/uL (ref 1.7–7.7)
NEUTROS PCT: 62 %
Platelets: 276 10*3/uL (ref 150–400)
RBC: 4.71 MIL/uL (ref 4.22–5.81)
RDW: 15.1 % (ref 11.5–15.5)
WBC: 11.1 10*3/uL — ABNORMAL HIGH (ref 4.0–10.5)

## 2015-11-04 MED ORDER — METOPROLOL TARTRATE 1 MG/ML IV SOLN
2.5000 mg | Freq: Once | INTRAVENOUS | Status: AC
  Administered 2015-11-04: 2.5 mg via INTRAVENOUS
  Filled 2015-11-04: qty 5

## 2015-11-04 MED ORDER — ADENOSINE 6 MG/2ML IV SOLN
INTRAVENOUS | Status: AC
  Administered 2015-11-04: 12 mg via INTRAVENOUS
  Filled 2015-11-04: qty 4

## 2015-11-04 MED ORDER — ADENOSINE 6 MG/2ML IV SOLN
12.0000 mg | Freq: Once | INTRAVENOUS | Status: AC
  Administered 2015-11-04: 12 mg via INTRAVENOUS

## 2015-11-04 MED ORDER — METOPROLOL TARTRATE 25 MG PO TABS: 25.0000 mg | ORAL_TABLET | Freq: Two times a day (BID) | ORAL | Status: DC

## 2015-11-04 NOTE — Discharge Instructions (Signed)

## 2015-11-04 NOTE — ED Notes (Signed)
Pt with palpitations and feels like heart racing, hx of same per pt.

## 2015-11-04 NOTE — ED Notes (Signed)
Upon getting to tx room, pt hr increased to 243, ekg performed, Dr Hyacinth MeekerMiller at bedside, additional orders given

## 2015-11-04 NOTE — ED Provider Notes (Signed)
CSN: [id number]     Arrival date & time 11/04/15  2102 History   First MD Initiated Contact with Patient 11/04/15 2107     Chief Complaint  Patient presents with  . Palpitations     (Consider location/radiation/quality/duration/timing/severity/associated sxs/prior Treatment) HPI Comments: -The patient is a 30 year old male, he presents to the hospital by private transport after developing severe acute onset of palpitations which occurred 10 minutes prior to arrival while he was eating a meal. He has had this happen twice in the past, each time he had a slightly elevated troponin at approximately 0.1, one of those times she was admitted to the hospital, evaluated by cardiology at which time it was determined that this was not ischemic and he can follow up outpatient. On neither of the prior evaluations was an acute arrhythmia found. The patient denies chest pain, nausea, vomiting, fever, chills, coughing, shortness of breath but does feel generally weak. He does have a follow-up appointment in 2 days with the cardiology team  Patient is a 30 y.o. male presenting with palpitations. The history is provided by the patient.  Palpitations   Past Medical History  Diagnosis Date  . Coronary artery disease   . NSTEMI (non-ST elevated myocardial infarction) (HCC) 09/09/2015  . KNEE PAIN 12/02/2008    Qualifier: Diagnosis of  By: Romeo Apple MD, Duffy Rhody    . Chest pain 09/09/2015  . Elevated troponin 09/09/2015  . Palpitations 09/09/2015  . Tobacco abuse 09/09/2015  . Pain in the chest    Past Surgical History  Procedure Laterality Date  . Knee surgery     History reviewed. No pertinent family history. Social History  Substance Use Topics  . Smoking status: Current Every Day Smoker -- 1.00 packs/day    Types: Cigarettes  . Smokeless tobacco: None  . Alcohol Use: No    Review of Systems  Cardiovascular: Positive for palpitations.  All other systems reviewed and are negative.     Allergies   Review of patient's allergies indicates no known allergies.  Home Medications   Prior to Admission medications   Medication Sig Start Date End Date Taking? Authorizing Provider  HYDROcodone-acetaminophen (NORCO) 7.5-325 MG tablet Take 1 tablet by mouth every 4 (four) hours as needed. Patient taking differently: Take 1 tablet by mouth every 4 (four) hours as needed for moderate pain.  10/16/15  Yes Darreld Mclean, MD  potassium chloride SA (K-DUR,KLOR-CON) 20 MEQ tablet Take 1 tablet (20 mEq total) by mouth 2 (two) times daily. 10/17/15  Yes Devoria Albe, MD  metoprolol (LOPRESSOR) 25 MG tablet Take 1 tablet (25 mg total) by mouth 2 (two) times daily. 11/04/15   Eber Hong, MD   BP 109/79 mmHg  Pulse 98  Temp(Src) 97.6 F (36.4 C) (Oral)  Resp 15  Ht  (1.727 m)  Wt 145 lb (65.772 kg)  BMI 22.05 kg/m2  SpO2 98% Physical Exam  Constitutional: He appears well-developed and well-nourished. No distress.  HENT:  Head: Normocephalic and atraumatic.  Mouth/Throat: Oropharynx is clear and moist. No oropharyngeal exudate.  Eyes: Conjunctivae and EOM are normal. Pupils are equal, round, and reactive to light. Right eye exhibits no discharge. Left eye exhibits no discharge. No scleral icterus.  Neck: Normal range of motion. Neck supple. No JVD present. No thyromegaly present.  Cardiovascular: Regular rhythm, normal heart sounds and intact distal pulses.  Exam reveals no gallop and no friction rub.   No murmur heard. Rapid rhythm, weak pulses, pulse of 240  Pulmonary/Chest:  Effort normal and breath sounds normal. No respiratory distress. He has no wheezes. He has no rales.  Abdominal: Soft. Bowel sounds are normal. He exhibits no distension and no mass. There is no tenderness.  Musculoskeletal: Normal range of motion. He exhibits no edema or tenderness.  Lymphadenopathy:    He has no cervical adenopathy.  Neurological: He is alert. Coordination normal.  Skin: Skin is warm and dry. No rash  noted. No erythema.  Psychiatric: He has a normal mood and affect. His behavior is normal.  Nursing note and vitals reviewed.   ED Course  Procedures (including critical care time) Labs Review Labs Reviewed  CBC WITH DIFFERENTIAL/PLATELET - Abnormal; Notable for the following:    WBC 11.1 (*)    All other components within normal limits  BASIC METABOLIC PANEL    Imaging Review No results found. I have personally reviewed and evaluated these images and lab results as part of my medical decision-making.   EKG Interpretation   Date/Time:  Tuesday November 04 2015 21:11:48 EDT Ventricular Rate:  242 PR Interval:  147 QRS Duration: 84 QT Interval:  205 QTC Calculation: 411 R Axis:   69 Text Interpretation:  Supraventricular tachycardia Consider right  ventricular hypertrophy Repolarization abnormality, prob rate related  Baseline wander in lead(s) I II III aVR aVL aVF V1 V2 Since last tracing  rate faster ST abnormlaities now prsent Confirmed by Oviya Ammar  MD, Radie Berges  [phone]) on 11/04/2015 9:29:27 PM      EKG Interpretation  Date/Time:  Tuesday November 04 2015 21:17:30 EDT Ventricular Rate:  98 PR Interval:  116 QRS Duration: 86 QT Interval:  302 QTC Calculation: 385 R Axis:   73 Text Interpretation:  Poor data quality, interpretation may be adversely affected Sinus rhythm with Premature ventricular complexes or Fusion complexes Otherwise normal ECG abnormal QRS complexes transient Since last tracing Supraventricular tachycardia has resolved. Confirmed by Hyacinth MeekerMILLER  MD, Agapita Savarino ([id number]) on 11/04/2015 9:30:15 PM       EKG Interpretation  Date/Time:  Tuesday November 04 2015 21:23:16 EDT Ventricular Rate:  100 PR Interval:  125 QRS Duration: 84 QT Interval:  339 QTC Calculation: 437 R Axis:   76 Text Interpretation:  Sinus tachycardia no delta wave st segment abnormalities have resolved. Confirmed by Buelah Rennie  MD, Cameryn Schum ([id number]) on 11/04/2015 9:31:42 PM         MDM   Final  diagnoses:  SVT (supraventricular tachycardia) (HCC)    Vagal maneuvers were unsuccessful, the patient remained tachycardic with hypotension of 99 systolic. The patient was informed of the need for adenosine cardioversion to which he agreed after being informed of the risks benefits and alternatives.  12 mg of adenosine were given one time successfully with conversion to normal sinus rhythm. Initial EKG was abnormal with ST depression diffusely and rate of 240  Post adenosine - is 100 rate with normal ST and T segments / waves  Will d/w cardiology - no delta wave seen on post adenosine ECG.  D/w dr Onalee Huaalvarez who agrees with lopressor outpt fu in 2 days at appointment   Meds given in ED:  Medications  adenosine (ADENOCARD) 6 MG/2ML injection 12 mg (12 mg Intravenous Given 11/04/15 2117)  metoprolol (LOPRESSOR) injection 2.5 mg (2.5 mg Intravenous Given 11/04/15 2155)    New Prescriptions   METOPROLOL (LOPRESSOR) 25 MG TABLET    Take 1 tablet (25 mg total) by mouth 2 (two) times daily.      Eber HongBrian Godwin Tedesco, MD 11/04/15 2156

## 2015-11-06 ENCOUNTER — Encounter: Payer: Self-pay | Admitting: Cardiology

## 2015-11-06 NOTE — Progress Notes (Signed)
This encounter was created in error - please disregard.

## 2015-11-12 ENCOUNTER — Ambulatory Visit (INDEPENDENT_AMBULATORY_CARE_PROVIDER_SITE_OTHER): Payer: Self-pay | Admitting: Orthopaedic Surgery

## 2015-11-12 ENCOUNTER — Encounter: Payer: Self-pay | Admitting: Orthopaedic Surgery

## 2015-11-12 VITALS — BP 135/86 | HR 76 | Temp 98.4°F | Resp 16 | Ht 68.0 in | Wt 145.0 lb

## 2015-11-12 DIAGNOSIS — R0789 Other chest pain: Secondary | ICD-10-CM

## 2015-11-12 DIAGNOSIS — M25512 Pain in left shoulder: Secondary | ICD-10-CM

## 2015-11-12 DIAGNOSIS — I214 Non-ST elevation (NSTEMI) myocardial infarction: Secondary | ICD-10-CM

## 2015-11-12 MED ORDER — HYDROCODONE-ACETAMINOPHEN 7.5-325 MG PO TABS: 1.0000 | ORAL_TABLET | ORAL | Status: DC | PRN

## 2015-11-12 NOTE — Progress Notes (Signed)
Patient ID: Alan Curtis, male   DOB: 01/15/1986, 30 y.o.   MRN: [id number]  CC:  My left shoulder is still hurting at times.  I have had heart problems.  He has been seen several times in the ER for chest pain and heart problems.  I briefly reviewed the notes and he has had supraventricular tachycardia with elevated enzymes.  He was told [place] go to [place] in [place] but he has no transportation. I have told him to go across the street to the heart center and tell them what has happened and see if they can see him promptly.  He has lost his job and has no funds.  His left shoulder is stable.  I am more concerned about his cardiac status.  His pulse is normal today and vitals are normal.  BP 135/86 mmHg  Pulse 76  Temp(Src) 98.4 F (36.9 C)  Resp 16  Ht 5\' 8"  (1.727 m)  Wt 145 lb (65.772 kg)  BMI 22.05 kg/m2  His left shoulder has good motion with pain fully overhead and against resistance.  NV is intact.  Encounter Diagnoses  Name Primary?  . Left shoulder pain Yes  . NSTEMI (non-ST elevated myocardial infarction) (HCC)   . Other chest pain     I will see him in three months.  He is to go to the heart care center across the street as soon as he leaves here.

## 2015-12-03 NOTE — Progress Notes (Signed)
Electrophysiology Office Note   Date:  12/04/2015   ID:  Alan Curtis, DOB 1985/12/01, MRN [id number]  PCP:  Milana Obey, MD  Primary Electrophysiologist:  Regan Lemming, MD    Chief Complaint  Patient presents with  . Advice Only  . SVT     History of Present Illness: Alan Curtis is a 30 y.o. male who presents today for electrophysiology evaluation.   He was seen in the ER on 10/17/15 with complaints of left sided chest pain.  He was smoking a cigarette in bed when it occurred.  He also had SOB, diaphoresis, and palpitations that lasted 15 minutes.  He has had symptoms in the past but not as severe as this episode.  When in the ER, he went back into SVT and was converted with adenosine.  The HR on the ECG was 240.  Since he was seen in the emergency room, he has felt much better. He was put on metoprolol, and this seems to have helped with his palpitations. Prior to the metoprolol, he is having palpitations once every 3 weeks or so. Since then he has not had any palpitations.   Today, he denies symptoms of palpitations, chest pain, shortness of breath, orthopnea, PND, lower extremity edema, claudication, dizziness, presyncope, syncope, bleeding, or neurologic sequela. The patient is tolerating medications without difficulties and is otherwise without complaint today.    Past Medical History  Diagnosis Date  . Coronary artery disease   . NSTEMI (non-ST elevated myocardial infarction) (HCC) 09/09/2015  . KNEE PAIN 12/02/2008    Qualifier: Diagnosis of  By: Romeo Apple MD, Duffy Rhody    . Chest pain 09/09/2015  . Elevated troponin 09/09/2015  . Palpitations 09/09/2015  . Tobacco abuse 09/09/2015  . Pain in the chest    Past Surgical History  Procedure Laterality Date  . Knee surgery       Current Outpatient Prescriptions  Medication Sig Dispense Refill  . HYDROcodone-acetaminophen (NORCO) 7.5-325 MG tablet Take 1 tablet by mouth every 4 (four) hours as needed. 120  tablet 0  . metoprolol (LOPRESSOR) 25 MG tablet Take 1 tablet (25 mg total) by mouth 2 (two) times daily. 60 tablet 1  . potassium chloride SA (K-DUR,KLOR-CON) 20 MEQ tablet Take 1 tablet (20 mEq total) by mouth 2 (two) times daily. 20 tablet 0   No current facility-administered medications for this visit.    Allergies:   Review of patient's allergies indicates no known allergies.   Social History:  The patient  reports that he has been smoking Cigarettes.  He has been smoking about 1.00 pack per day. He does not have any smokeless tobacco history on file. He reports that he does not drink alcohol or use illicit drugs.   Family History:  The patient's family history includes Asthma in his brother; Diabetes in his maternal grandmother; Hypertension in his father, maternal grandmother, and mother.    ROS:  Please see the history of present illness.   Otherwise, review of systems is positive for none.   All other systems are reviewed and negative.    PHYSICAL EXAM: VS:  BP 132/98 mmHg  Pulse 70  Ht  (1.727 m)  Wt 147 lb 12.8 oz (67.042 kg)  BMI 22.48 kg/m2 , BMI Body mass index is 22.48 kg/(m^2). GEN: Well nourished, well developed, in no acute distress HEENT: normal Neck: no JVD, carotid bruits, or masses Cardiac: RRR; no murmurs, rubs, or gallops,no edema  Respiratory:  clear to  auscultation bilaterally, normal work of breathing GI: soft, nontender, nondistended, + BS MS: no deformity or atrophy Skin: warm and dry Neuro:  Strength and sensation are intact Psych: euthymic mood, full affect  EKG:  EKG is not ordered today.  Recent Labs: 09/10/2015: TSH 0.718 10/17/2015: ALT 20; Magnesium 1.9 11/04/2015: BUN 15; Creatinine, Ser 1.20; Hemoglobin 13.8; Platelets 276; Potassium 3.9; Sodium 140    Lipid Panel     Component Value Date/Time   CHOL 122 09/10/2015 0206   TRIG 82 09/10/2015 0206   HDL 38* 09/10/2015 0206   CHOLHDL 3.2 09/10/2015 0206   VLDL 16 09/10/2015 0206    LDLCALC 68 09/10/2015 0206     Wt Readings from Last 3 Encounters:  12/04/15 147 lb 12.8 oz (67.042 kg)  11/12/15 145 lb (65.772 kg)  11/04/15 145 lb (65.772 kg)      Other studies Reviewed: Additional studies/ records that were reviewed today include: TTE 09/10/15  Review of the above records today demonstrates:  - Normal LV wall thickness with LVEF 55-60% and normal diastolic  function. Trivial mitral and tricuspid regurgitation.   ASSESSMENT AND PLAN:  1.  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  Did convert with adenosine so likely due to an AV nodal mechanism.  Discussed options of treatment including medical management and ablation. At this time, he would like to be off of medications. Discussed risks and benefits of ablation.  Risks include bleeding, tamponade, heart block, and stroke among others. He understands the risks, and feels that ablation is likely the best option for him. We'll work to schedule this as soon as possible.    Current medicines are reviewed at length with the patient today.   The patient does not have concerns regarding his medicines.  The following changes were made today:  none  Labs/ tests ordered today include:  No orders of the defined types were placed in this encounter.     Disposition:   FU with Rawn Quiroa  3 months  Signed, Merrillyn Ackerley Jorja LoaMartin Meigan Pates, MD  12/04/2015 9:53 AM     [facility] [address] [place] [address] [phone] (office) [phone] (fax)

## 2015-12-04 ENCOUNTER — Encounter: Payer: Self-pay | Admitting: *Deleted

## 2015-12-04 ENCOUNTER — Encounter: Payer: Self-pay | Admitting: Cardiology

## 2015-12-04 ENCOUNTER — Ambulatory Visit (INDEPENDENT_AMBULATORY_CARE_PROVIDER_SITE_OTHER): Payer: Self-pay | Admitting: Cardiology

## 2015-12-04 VITALS — BP 132/98 | HR 70 | Ht 68.0 in | Wt 147.8 lb

## 2015-12-04 DIAGNOSIS — I471 Supraventricular tachycardia: Secondary | ICD-10-CM

## 2015-12-04 DIAGNOSIS — Z01812 Encounter for preprocedural laboratory examination: Secondary | ICD-10-CM

## 2015-12-04 NOTE — Addendum Note (Signed)
Addended by: Baird LyonsPRICE, Taressa Rauh L on: 12/04/2015 10:11 AM   Modules accepted: Orders

## 2015-12-04 NOTE — Patient Instructions (Signed)
Medication Instructions:  Your physician recommends that you continue on your current medications as directed. Please refer to the Current Medication list given to you today.  Labwork: Your physician recommends that you return for lab work 7-10 days prior to procedure on 12/31/15  Testing/Procedures: Your physician has recommended that you have an ablation. Catheter ablation is a medical procedure used to treat some cardiac arrhythmias (irregular heartbeats). During catheter ablation, a long, thin, flexible tube is put into a blood vessel in your groin (upper thigh), or neck. This tube is called an ablation catheter. It is then guided to your heart through the blood vessel. Radio frequency waves destroy small areas of heart tissue where abnormal heartbeats may cause an arrhythmia to start. Please see the instruction sheet given to you today.  Follow-Up: Your physician recommends that you schedule a follow-up appointment in: 4 weeks, after your procedure on 12/31/15, with Dr. Elberta Fortisamnitz.  If you need a refill on your cardiac medications before your next appointment, please call your pharmacy.  Thank you for choosing [facility]!!   Dory HornSherri August Longest, RN [phone]  Any Other Special Instructions Will Be Listed Below (If Applicable). Cardiac Ablation Cardiac ablation is a procedure to disable a small amount of heart tissue in very specific places. The heart has many electrical connections. Sometimes these connections are abnormal and can cause the heart to beat very fast or irregularly. By disabling some of the problem areas, heart rhythm can be improved or made normal. Ablation is done for people who:   Have Wolff-Parkinson-White syndrome.   Have other fast heart rhythms (tachycardia).   Have taken medicines for an abnormal heart rhythm (arrhythmia) that resulted in:   No success.   Side effects.   May have a high-risk heartbeat that could result in death.  LET [facility]  PROVIDER KNOW ABOUT:   Any allergies you have or any previous reactions you have had to X-ray dye, food (such as seafood), medicine, or tape.   All medicines you are taking, including vitamins, herbs, eye drops, creams, and over-the-counter medicines.   Previous problems you or members of your family have had with the use of anesthetics.   Any blood disorders you have.   Previous surgeries or procedures (such as a kidney transplant) you have had.   Medical conditions you have (such as kidney failure).  RISKS AND COMPLICATIONS Generally, cardiac ablation is a safe procedure. However, problems can occur and include:   Increased risk of cancer. Depending on how long it takes to do the ablation, the dose of radiation can be high.  Bruising and bleeding where a thin, flexible tube (catheter) was inserted during the procedure.   Bleeding into the chest, especially into the sac that surrounds the heart (serious).  Need for a permanent pacemaker if the normal electrical system is damaged.   The procedure may not be fully effective, and this may not be recognized for months. Repeat ablation procedures are sometimes required. BEFORE THE PROCEDURE   Follow any instructions from your health care provider regarding eating and drinking before the procedure.   Take your medicines as directed at regular times with water, unless instructed otherwise by your health care provider. If you are taking diabetes medicine, including insulin, ask how you are to take it and if there are any special instructions you should follow. It is common to adjust insulin dosing the day of the ablation.  PROCEDURE  An ablation is usually performed in a catheterization laboratory  with the guidance of fluoroscopy. Fluoroscopy is a type of X-ray that helps your health care provider see images of your heart during the procedure.   An ablation is a minimally invasive procedure. This means a small cut (incision) is  made in either your neck or groin. Your health care provider will decide where to make the incision based on your medical history and physical exam.  An IV tube will be started before the procedure begins. You will be given an anesthetic or medicine to help you relax (sedative).  The skin on your neck or groin will be numbed. A needle will be inserted into a large vein in your neck or groin and catheters will be threaded to your heart.  A special dye that shows up on fluoroscopy pictures may be injected through the catheter. The dye helps your health care provider see the area of the heart that needs treatment.  The catheter has electrodes on the tip. When the area of heart tissue that is causing the arrhythmia is found, the catheter tip will send an electrical current to the area and "scar" the tissue. Three types of energy can be used to ablate the heart tissue:   Heat (radiofrequency energy).   Laser energy.   Extreme cold (cryoablation).   When the area of the heart has been ablated, the catheter will be taken out. Pressure will be held on the insertion site. This will help the insertion site clot and keep it from bleeding. A bandage will be placed on the insertion site.  AFTER THE PROCEDURE   After the procedure, you will be taken to a recovery area where your vital signs (blood pressure, heart rate, and breathing) will be monitored. The insertion site will also be monitored for bleeding.   You will need to lie still for 4-6 hours. This is to ensure you do not bleed from the catheter insertion site.    This information is not intended to replace advice given to you by your health care provider. Make sure you discuss any questions you have with your health care provider.   Document Released: 11/21/2008 Document Revised: 07/26/2014 Document Reviewed: 11/27/2012 Elsevier Interactive Patient Education [employer]

## 2015-12-09 ENCOUNTER — Encounter (HOSPITAL_COMMUNITY): Payer: Self-pay | Admitting: Emergency Medicine

## 2015-12-09 ENCOUNTER — Emergency Department (HOSPITAL_COMMUNITY)
Admission: EM | Admit: 2015-12-09 | Discharge: 2015-12-10 | Disposition: A | Discharge status: Home / Self Care | Payer: Self-pay | Attending: Emergency Medicine | Admitting: Emergency Medicine

## 2015-12-09 DIAGNOSIS — I2510 Atherosclerotic heart disease of native coronary artery without angina pectoris: Secondary | ICD-10-CM | POA: Insufficient documentation

## 2015-12-09 DIAGNOSIS — I471 Supraventricular tachycardia: Secondary | ICD-10-CM | POA: Insufficient documentation

## 2015-12-09 DIAGNOSIS — F17210 Nicotine dependence, cigarettes, uncomplicated: Secondary | ICD-10-CM | POA: Insufficient documentation

## 2015-12-09 MED ORDER — ADENOSINE 6 MG/2ML IV SOLN
INTRAVENOUS | Status: AC
  Filled 2015-12-09: qty 4

## 2015-12-09 NOTE — ED Provider Notes (Signed)
CSN: [id number]     Arrival date & time 12/09/15  2229 History  By signing my name below, I, Iona Beard, attest that this documentation has been prepared under the direction and in the presence of No att. providers found.   Electronically Signed: Iona Beard, ED Scribe. 12/10/2015. 12:49 AM  Chief Complaint  Patient presents with  . Tachycardia    The history is provided by the patient. No language interpreter was used.   HPI Comments: Alan Curtis is a 30 y.o. male who presents to the Emergency Department complaining of sudden onset, heart palpitations, ongoing for about thirty minutes. Pt has recently been treated for SVT and had an ablation scheduled for 12/31/2015. Pt also notes chest pain. No other associated symptoms. No worsening or alleviating factors noted. Pt denies shortness of breath, or any other pertinent symptoms. Pt admits to smoke marijuana around 4 PM today.   Past Medical History  Diagnosis Date  . Coronary artery disease   . NSTEMI (non-ST elevated myocardial infarction) (HCC) 09/09/2015  . KNEE PAIN 12/02/2008    Qualifier: Diagnosis of  By: Romeo Apple MD, Duffy Rhody    . Chest pain 09/09/2015  . Elevated troponin 09/09/2015  . Palpitations 09/09/2015  . Tobacco abuse 09/09/2015  . Pain in the chest    Past Surgical History  Procedure Laterality Date  . Knee surgery     Family History  Problem Relation Age of Onset  . Hypertension Mother   . Hypertension Father   . Asthma Brother   . Hypertension Maternal Grandmother   . Diabetes Maternal Grandmother    Social History  Substance Use Topics  . Smoking status: Current Every Day Smoker -- 1.00 packs/day    Types: Cigarettes  . Smokeless tobacco: None  . Alcohol Use: No    Review of Systems  Respiratory: Negative for shortness of breath.   Cardiovascular: Positive for chest pain and palpitations.  All other systems reviewed and are negative.   Allergies  Review of patient's allergies indicates  no known allergies.  Home Medications   Prior to Admission medications   Medication Sig Start Date End Date Taking? Authorizing Provider  HYDROcodone-acetaminophen (NORCO) 7.5-325 MG tablet Take 1 tablet by mouth every 4 (four) hours as needed. 11/12/15   Darreld Mclean, MD  metoprolol (LOPRESSOR) 25 MG tablet Take 1 tablet (25 mg total) by mouth 2 (two) times daily. 11/04/15   Eber Hong, MD  potassium chloride SA (K-DUR,KLOR-CON) 20 MEQ tablet Take 1 tablet (20 mEq total) by mouth 2 (two) times daily. 10/17/15   Devoria Albe, MD   BP 90/53 mmHg  Pulse 116  Temp(Src) 98.1 F (36.7 C) (Oral)  Resp 14  Ht  (1.727 m)  Wt 145 lb (65.772 kg)  BMI 22.05 kg/m2  SpO2 100% Physical Exam  Constitutional: He appears well-developed and well-nourished. No distress.  HENT:  Head: Normocephalic and atraumatic.  Eyes: Conjunctivae and EOM are normal.  Neck: Neck supple. No tracheal deviation present.  Cardiovascular: Regular rhythm and normal heart sounds.  Tachycardia present.   Not able to auscultate for murmur or gallop.   Pulmonary/Chest: Effort normal and breath sounds normal. Tachypnea noted. No respiratory distress. He has no wheezes. He has no rales.  Abdominal: Soft. He exhibits no distension. There is no tenderness.  Musculoskeletal: Normal range of motion.  Neurological: He is alert.  Skin: Skin is warm. He is not diaphoretic. No pallor.  Psychiatric: He has a normal mood and affect. His  behavior is normal.    ED Course  Procedures (including critical care time) DIAGNOSTIC STUDIES: Oxygen Saturation is 100% on RA, normal by my interpretation.    COORDINATION OF CARE: 11:11 PM Discussed treatment plan with pt at bedside and pt agreed to plan.  Labs Review Labs Reviewed - No data to display  Imaging Review No results found. I have personally reviewed and evaluated these images and lab results as part of my medical decision-making.   EKG Interpretation   Date/Time:   Tuesday Dec 09 2015 22:51:03 EDT Ventricular Rate:  239 PR Interval:    QRS Duration: 174 QT Interval:  170 QTC Calculation: 339 R Axis:   -48 Text Interpretation:   Critical Test Result: Arrhythmia Wide QRS  tachycardia Left axis deviation Right bundle branch block Abnormal ECG  Confirmed by [facility] MD, Fleur Audino [phone]([id number]) on 12/09/2015 10:55:52 PM    EKG Interpretation  Date/Time:  Tuesday Dec 09 2015 23:56:28 EDT Ventricular Rate:  83 PR Interval:  123 QRS Duration: 94 QT Interval:  370 QTC Calculation: 435 R Axis:   83 Text Interpretation:  Sinus rhythm RSR' in V1 or V2, right VCD or RVH improved SVT. no e/o ST changes now. tall t waves similar to prior.  Confirmed by [facility] MD, Barbara CowerJASON [phone]([id number]) on 12/10/2015 12:02:45 AM          MDM   Final diagnoses:  SVT (supraventricular tachycardia) (HCC)    Here with SVT. Cardioverted with vagal maneuver (blew on 10 cc syringe), observed for an hour, repeat ecg without abnormalities. Already scheduled for ablation. No indication for further workup/admission.   New Prescriptions: Discharge Medication List as of 12/10/2015 12:03 AM      I have personally and contemperaneously reviewed labs and imaging and used in my decision making as above.   A medical screening exam was performed and I feel the patient has had an appropriate workup for their chief complaint at this time and likelihood of emergent condition existing is low and thus workup can continue on an outpatient basis.. Their vital signs are stable. They have been counseled on decision, discharge, follow up and which symptoms necessitate immediate return to the emergency department.  They verbally stated understanding and agreement with plan and discharged in stable condition.   I personally performed the services described in this documentation, which was scribed in my presence. The recorded information has been reviewed and is accurate.      Marily MemosJason Conard Alvira, MD 12/10/15 [phone]

## 2015-12-09 NOTE — ED Notes (Signed)
During EKG, patient complaining of shortness of breath. Heart rate 239 on EKG.

## 2015-12-09 NOTE — ED Notes (Addendum)
Patient complaining of "fast heartbeat" starting approximately 20 minutes ago. States he has been treated here for SVT recently and is scheduled for an ablation on June 14. Also complaining of chest pain. Patient admits to smoking marijuana about 1600 today.

## 2015-12-11 ENCOUNTER — Telehealth: Payer: Self-pay | Admitting: Orthopaedic Surgery

## 2015-12-11 NOTE — Telephone Encounter (Signed)
Hydrocodone-Acetaminophen 7.5/325mg Qty 120 Tablets °

## 2015-12-16 MED ORDER — HYDROCODONE-ACETAMINOPHEN 7.5-325 MG PO TABS: 1.0000 | ORAL_TABLET | ORAL | Status: DC | PRN

## 2015-12-16 NOTE — Telephone Encounter (Signed)
Rx done. 

## 2015-12-23 ENCOUNTER — Other Ambulatory Visit: Payer: Self-pay

## 2015-12-31 ENCOUNTER — Ambulatory Visit (HOSPITAL_COMMUNITY): Admit: 2015-12-31 | Payer: Self-pay | Admitting: Cardiology

## 2015-12-31 ENCOUNTER — Encounter (HOSPITAL_COMMUNITY): Payer: Self-pay

## 2015-12-31 SURGERY — A-FLUTTER/A-TACH/SVT ABLATION

## 2016-01-06 NOTE — Progress Notes (Signed)
This encounter was created in error - please disregard.

## 2016-01-07 ENCOUNTER — Encounter: Payer: Self-pay | Admitting: Cardiology

## 2016-01-12 ENCOUNTER — Telehealth: Payer: Self-pay | Admitting: Orthopaedic Surgery

## 2016-01-12 MED ORDER — HYDROCODONE-ACETAMINOPHEN 7.5-325 MG PO TABS: 1.0000 | ORAL_TABLET | ORAL | Status: DC | PRN

## 2016-01-12 NOTE — Telephone Encounter (Signed)
Rx done. 

## 2016-01-12 NOTE — Telephone Encounter (Signed)
Patient called and requested a refill on Norco 75-325 mgs.Qty 120               Sig: Take 1 tablet by mouth every 4 (four) hours as needed.

## 2016-01-16 ENCOUNTER — Telehealth: Payer: Self-pay | Admitting: *Deleted

## 2016-01-16 NOTE — Telephone Encounter (Signed)
lmtcb  (had to cancel ablation procedure secondary to no insurance.  Calling patient to give him info for how to apply for orange card through the hospital)

## 2016-01-25 NOTE — Progress Notes (Signed)
Electrophysiology Office Note   Date:  01/26/2016   ID:  Alan Curtis, DOB 09/17/1985, MRN [id number]  PCP:  Milana ObeyStephen D Knowlton, MD  Primary Electrophysiologist:  Delores Edelstein Jorja LoaMartin Bita Cartwright, MD    Chief Complaint  Patient presents with  . Follow-up     History of Present Illness: Alan Curtis is a 30 y.o. male who presents today for electrophysiology evaluation.   He was seen in the ER on 10/17/15 with complaints of left sided chest pain.  He was smoking a cigarette in bed when it occurred.  He also had SOB, diaphoresis, and palpitations that lasted 15 minutes.  He has had symptoms in the past but not as severe as this episode.  When in the ER, he went back into SVT and was converted with adenosine.  The HR on the ECG was 240.    In May, he went back to the emergency room with similar episodes of SVT. He tried vagal maneuvers which converted him back to sinus rhythm. He was watched for an hour and discharged. Since then he has tried vagal maneuvers multiple times when his heart rate went up which has worked every time. He has not needed to call EMS. He says that he would like to try medications at this time and does not want ablation.   Today, he denies symptoms of palpitations, chest pain, shortness of breath, orthopnea, PND, lower extremity edema, claudication, dizziness, presyncope, syncope, bleeding, or neurologic sequela. The patient is tolerating medications without difficulties and is otherwise without complaint today.    Past Medical History  Diagnosis Date  . Coronary artery disease   . NSTEMI (non-ST elevated myocardial infarction) (HCC) 09/09/2015  . KNEE PAIN 12/02/2008    Qualifier: Diagnosis of  By: Romeo AppleHarrison MD, Duffy RhodyStanley    . Chest pain 09/09/2015  . Elevated troponin 09/09/2015  . Palpitations 09/09/2015  . Tobacco abuse 09/09/2015  . Pain in the chest    Past Surgical History  Procedure Laterality Date  . Knee surgery       Current Outpatient Prescriptions    Medication Sig Dispense Refill  . HYDROcodone-acetaminophen (NORCO) 7.5-325 MG tablet Take 1 tablet by mouth every 4 (four) hours as needed. 120 tablet 0  . metoprolol tartrate (LOPRESSOR) 50 MG tablet Take 1 tablet (50 mg total) by mouth 2 (two) times daily. 60 tablet 6   No current facility-administered medications for this visit.    Allergies:   Review of patient's allergies indicates no known allergies.   Social History:  The patient  reports that he has been smoking Cigarettes.  He has been smoking about 1.00 pack per day. He does not have any smokeless tobacco history on file. He reports that he uses illicit drugs (Marijuana). He reports that he does not drink alcohol.   Family History:  The patient's family history includes Asthma in his brother; Diabetes in his maternal grandmother; Hypertension in his father, maternal grandmother, and mother.    ROS:  Please see the history of present illness.   Otherwise, review of systems is positive for none.   All other systems are reviewed and negative.    PHYSICAL EXAM: VS:  BP 116/80 mmHg  Pulse 58  Ht 5\' 8"  (1.727 m)  Wt 149 lb 9.6 oz (67.858 kg)  BMI 22.75 kg/m2 , BMI Body mass index is 22.75 kg/(m^2). GEN: Well nourished, well developed, in no acute distress HEENT: normal Neck: no JVD, carotid bruits, or masses Cardiac: RRR; no murmurs,  rubs, or gallops,no edema  Respiratory:  clear to auscultation bilaterally, normal work of breathing GI: soft, nontender, nondistended, + BS MS: no deformity or atrophy Skin: warm and dry Neuro:  Strength and sensation are intact Psych: euthymic mood, full affect  EKG:  EKG is not ordered today.  Recent Labs: 09/10/2015: TSH 0.718 10/17/2015: ALT 20; Magnesium 1.9 11/04/2015: BUN 15; Creatinine, Ser 1.20; Hemoglobin 13.8; Platelets 276; Potassium 3.9; Sodium 140    Lipid Panel     Component Value Date/Time   CHOL 122 09/10/2015 0206   TRIG 82 09/10/2015 0206   HDL 38* 09/10/2015 0206    CHOLHDL 3.2 09/10/2015 0206   VLDL 16 09/10/2015 0206   LDLCALC 68 09/10/2015 0206     Wt Readings from Last 3 Encounters:  01/26/16 149 lb 9.6 oz (67.858 kg)  12/09/15 145 lb (65.772 kg)  12/04/15 147 lb 12.8 oz (67.042 kg)      Other studies Reviewed: Additional studies/ records that were reviewed today include: TTE 09/10/15  Review of the above records today demonstrates:  - Normal LV wall thickness with LVEF 55-60% and normal diastolic  function. Trivial mitral and tricuspid regurgitation.   ASSESSMENT AND PLAN:  1.  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  Did convert with adenosine so likely due to an AV nodal mechanism.  Discussed options of treatment including medical management and ablation. At this time, he would prefer medical management for his SVT. He is on 25 mg of metoprolol twice daily, and we Sinclair Alligood therefore increase to 50 mg twice daily for further rhythm control. I also explained to him vagal maneuvers and variations which she can use. We Aideen Fenster see him back in 6 months and potentially discuss ablation further at that time.    Current medicines are reviewed at length with the patient today.   The patient does not have concerns regarding his medicines.  The following changes were made today:  Increase metoprolol to 50 mg twice daily  Labs/ tests ordered today include:  No orders of the defined types were placed in this encounter.     Disposition:   FU with Faysal Fenoglio  6 months  Signed, Lonney Revak Jorja Loa, MD  01/26/2016 11:50 AM     [facility] [address] [place] [address] [phone] (office) [phone] (fax)

## 2016-01-26 ENCOUNTER — Encounter: Payer: Self-pay | Admitting: Cardiology

## 2016-01-26 ENCOUNTER — Ambulatory Visit (INDEPENDENT_AMBULATORY_CARE_PROVIDER_SITE_OTHER): Payer: Self-pay | Admitting: Cardiology

## 2016-01-26 VITALS — BP 116/80 | HR 58 | Ht 68.0 in | Wt 149.6 lb

## 2016-01-26 DIAGNOSIS — I471 Supraventricular tachycardia: Secondary | ICD-10-CM

## 2016-01-26 MED ORDER — METOPROLOL TARTRATE 50 MG PO TABS: 50.0000 mg | ORAL_TABLET | Freq: Two times a day (BID) | ORAL | Status: DC

## 2016-01-26 NOTE — Patient Instructions (Signed)
Medication Instructions:  Your physician has recommended you make the following change in your medication:  1) INCREASE Metoprolol to 50 mg twice a day  Labwork: None ordered  Testing/Procedures: None ordered  Follow-Up: Your physician wants you to follow-up in: 6 months with Dr. Elberta Fortisamnitz. You will receive a reminder letter in the mail two months in advance. If you don't receive a letter, please call our office to schedule the follow-up appointment.  If you need a refill on your cardiac medications before your next appointment, please call your pharmacy.  Thank you for choosing [facility]!!   Dory HornSherri Price, RN [phone]

## 2016-01-28 ENCOUNTER — Ambulatory Visit: Payer: Self-pay | Admitting: Cardiology

## 2016-02-06 ENCOUNTER — Emergency Department (HOSPITAL_COMMUNITY)
Admission: EM | Admit: 2016-02-06 | Discharge: 2016-02-07 | Disposition: A | Discharge status: Home / Self Care | Payer: Self-pay | Attending: Emergency Medicine | Admitting: Emergency Medicine

## 2016-02-06 ENCOUNTER — Encounter (HOSPITAL_COMMUNITY): Payer: Self-pay | Admitting: *Deleted

## 2016-02-06 ENCOUNTER — Emergency Department (HOSPITAL_COMMUNITY): Payer: Self-pay

## 2016-02-06 DIAGNOSIS — F17210 Nicotine dependence, cigarettes, uncomplicated: Secondary | ICD-10-CM | POA: Insufficient documentation

## 2016-02-06 DIAGNOSIS — R6884 Jaw pain: Secondary | ICD-10-CM | POA: Insufficient documentation

## 2016-02-06 DIAGNOSIS — I252 Old myocardial infarction: Secondary | ICD-10-CM | POA: Insufficient documentation

## 2016-02-06 LAB — COMPREHENSIVE METABOLIC PANEL WITH GFR
ALT: 13 U/L — ABNORMAL LOW (ref 17–63)
AST: 14 U/L — ABNORMAL LOW (ref 15–41)
Albumin: 4.3 g/dL (ref 3.5–5.0)
Alkaline Phosphatase: 81 U/L (ref 38–126)
Anion gap: 4 — ABNORMAL LOW (ref 5–15)
BUN: 6 mg/dL (ref 6–20)
CO2: 31 mmol/L (ref 22–32)
Calcium: 9.2 mg/dL (ref 8.9–10.3)
Chloride: 106 mmol/L (ref 101–111)
Creatinine, Ser: 0.98 mg/dL (ref 0.61–1.24)
GFR calc Af Amer: >60 mL/min
GFR calc non Af Amer: >60 mL/min
Glucose, Bld: 107 mg/dL — ABNORMAL HIGH (ref 65–99)
Potassium: 3.7 mmol/L (ref 3.5–5.1)
Sodium: 141 mmol/L (ref 135–145)
Total Bilirubin: 0.4 mg/dL (ref 0.3–1.2)
Total Protein: 8.1 g/dL (ref 6.5–8.1)

## 2016-02-06 LAB — CBC WITH DIFFERENTIAL/PLATELET
Basophils Absolute: 0 10*3/uL (ref 0.0–0.1)
Basophils Relative: 0 %
Eosinophils Absolute: 0.1 10*3/uL (ref 0.0–0.7)
Eosinophils Relative: 0 %
HCT: 43 % (ref 39.0–52.0)
Hemoglobin: 14.4 g/dL (ref 13.0–17.0)
Lymphocytes Relative: 10 %
Lymphs Abs: 2 10*3/uL (ref 0.7–4.0)
MCH: 30.3 pg (ref 26.0–34.0)
MCHC: 33.5 g/dL (ref 30.0–36.0)
MCV: 90.5 fL (ref 78.0–100.0)
Monocytes Absolute: 1 10*3/uL (ref 0.1–1.0)
Monocytes Relative: 5 %
Neutro Abs: 16.4 10*3/uL — ABNORMAL HIGH (ref 1.7–7.7)
Neutrophils Relative %: 85 %
Platelets: 262 10*3/uL (ref 150–400)
RBC: 4.75 MIL/uL (ref 4.22–5.81)
RDW: 14.2 % (ref 11.5–15.5)
WBC: 19.4 10*3/uL — ABNORMAL HIGH (ref 4.0–10.5)

## 2016-02-06 LAB — TROPONIN I: Troponin I: <0.03 ng/mL (ref <0.03)

## 2016-02-06 MED ORDER — HYDROCODONE-ACETAMINOPHEN 5-325 MG PO TABS
1.0000 | ORAL_TABLET | Freq: Once | ORAL | Status: AC
Start: 2016-02-06 — End: 2016-02-06
  Administered 2016-02-06: 1 via ORAL
  Filled 2016-02-06: qty 1

## 2016-02-06 MED ORDER — ALBUTEROL SULFATE HFA 108 (90 BASE) MCG/ACT IN AERS: 4.0000 | INHALATION_SPRAY | RESPIRATORY_TRACT | Status: DC

## 2016-02-06 MED ORDER — NAPROXEN 250 MG PO TABS: 250.0000 mg | ORAL_TABLET | Freq: Two times a day (BID) | ORAL | Status: DC | PRN

## 2016-02-06 MED ORDER — PENICILLIN V POTASSIUM 250 MG PO TABS: 250.0000 mg | ORAL_TABLET | Freq: Four times a day (QID) | ORAL | Status: DC

## 2016-02-06 NOTE — ED Provider Notes (Signed)
CSN: [id number]     Arrival date & time 02/06/16  2044 History   First MD Initiated Contact with Patient 02/06/16 2121     Chief Complaint  Patient presents with  . Jaw Pain      HPI Pt was seen at 2120. Per pt, c/o gradual onset and persistence of waxing and waning left lower jaw "pain" that began when he woke up from a nap at 1400. Pt states the pain went away several hours later, but then returned after he ate dinner, approximately 1900. Describes the pain as "throbbing" and "like when I needed my wisdom teeth taken out." Denies intra-oral edema, no palpitations, no CP, no SOB/cough, no neck pain, no sore throat, no dysphagia, no ear pain, no injury.    Past Medical History  Diagnosis Date  . NSTEMI (non-ST elevated myocardial infarction) (HCC) 09/09/2015  . KNEE PAIN 12/02/2008    Qualifier: Diagnosis of  By: Romeo Apple MD, Duffy Rhody    . Chest pain 09/09/2015  . Elevated troponin 09/09/2015  . Palpitations 09/09/2015  . Tobacco abuse 09/09/2015  . Pain in the chest    Past Surgical History  Procedure Laterality Date  . Knee surgery     Family History  Problem Relation Age of Onset  . Hypertension Mother   . Hypertension Father   . Asthma Brother   . Hypertension Maternal Grandmother   . Diabetes Maternal Grandmother    Social History  Substance Use Topics  . Smoking status: Current Every Day Smoker -- 1.00 packs/day    Types: Cigarettes  . Smokeless tobacco: None  . Alcohol Use: No    Review of Systems ROS: Statement: All systems negative except as marked or noted in the HPI; Constitutional: Negative for fever and chills. ; ; Eyes: Negative for eye pain, redness and discharge. ; ; ENMT: +jaw pain. Negative for ear pain, hoarseness, nasal congestion, sinus pressure and sore throat. ; ; Cardiovascular: Negative for chest pain, palpitations, diaphoresis, dyspnea and peripheral edema. ; ; Respiratory: Negative for cough, wheezing and stridor. ; ; Gastrointestinal: Negative for  nausea, vomiting, diarrhea, abdominal pain, blood in stool, hematemesis, jaundice and rectal bleeding. . ; ; Genitourinary: Negative for dysuria, flank pain and hematuria. ; ; Musculoskeletal: Negative for back pain and neck pain. Negative for swelling and trauma.; ; Skin: Negative for pruritus, rash, abrasions, blisters, bruising and skin lesion.; ; Neuro: Negative for headache, lightheadedness and neck stiffness. Negative for weakness, altered level of consciousness, altered mental status, extremity weakness, paresthesias, involuntary movement, seizure and syncope.      Allergies  Review of patient's allergies indicates no known allergies.  Home Medications   Prior to Admission medications   Medication Sig Start Date End Date Taking? Authorizing Provider  HYDROcodone-acetaminophen (NORCO) 7.5-325 MG tablet Take 1 tablet by mouth every 4 (four) hours as needed. 01/12/16   Darreld Mclean, MD  metoprolol tartrate (LOPRESSOR) 50 MG tablet Take 1 tablet (50 mg total) by mouth 2 (two) times daily. 01/26/16   Will Jorja Loa, MD   BP 156/98 mmHg  Pulse 78  Temp(Src) 98.5 F (36.9 C) (Oral)  Resp 16  Ht  (1.727 m)  Wt 145 lb (65.772 kg)  BMI 22.05 kg/m2  SpO2 100% Physical Exam  2125: Physical examination: Vital signs and O2 SAT: Reviewed; Constitutional: Well developed, Well nourished, Well hydrated, In no acute distress; Head and Face: Normocephalic, Atraumatic. No facial swelling, no rash.; Eyes: EOMI, PERRL, No scleral icterus; ENMT: Mouth and pharynx  normal, Left TM normal, Right TM normal, Mucous membranes moist, Left lower teeth NT to percuss. Scattered mild dental decay. No gingival erythema, edema, fluctuance, or drainage.  No intra-oral edema. No submandibular or sublingual edema. No hoarse voice, no drooling, no stridor. No trismus. .;  Neck: Supple, Full range of motion, No lymphadenopathy; Cardiovascular: Regular rate and rhythm, No murmur, rub, or gallop; Respiratory: Breath  sounds clear & equal bilaterally, No rales, rhonchi, wheezes.  Speaking full sentences with ease, Normal respiratory effort/excursion; Chest: Nontender, Movement normal; Abdomen: Soft, Nontender, Nondistended, Normal bowel sounds; Genitourinary: No CVA tenderness; Extremities: Pulses normal, No tenderness, No edema, No calf edema or asymmetry.; Neuro: AA&Ox3, Major CN grossly intact.  Speech clear. No gross focal motor or sensory deficits in extremities.; Skin: Color normal, Warm, Dry.   ED Course  Procedures (including critical care time) Labs Review  Imaging Review  I have personally reviewed and evaluated these images and lab results as part of my medical decision-making.   EKG Interpretation   Date/Time:  Friday February 06 2016 20:51:15 EDT Ventricular Rate:  73 PR Interval:  122 QRS Duration: 96 QT Interval:  368 QTC Calculation: 405 R Axis:   77 Text Interpretation:  Normal sinus rhythm with sinus arrhythmia Incomplete  right bundle branch block When compared with ECG of 12/09/2015 No  significant change was found Confirmed by [facility]  MD, Nicholos JohnsKATHLEEN [phone]([id number]) on  02/06/2016 9:37:44 PM      MDM  MDM Reviewed: previous chart, nursing note and vitals Reviewed previous: labs and ECG Interpretation: labs, ECG, x-ray and CT scan     Results for orders placed or performed during the hospital encounter of 02/06/16  Troponin I  Result Value Ref Range   Troponin I <0.03 <0.03 ng/mL  CBC with Differential  Result Value Ref Range   WBC 19.4 (H) 4.0 - 10.5 K/uL   RBC 4.75 4.22 - 5.81 MIL/uL   Hemoglobin 14.4 13.0 - 17.0 g/dL   HCT 60.443.0 54.039.0 - 98.152.0 %   MCV 90.5 78.0 - 100.0 fL   MCH 30.3 26.0 - 34.0 pg   MCHC 33.5 30.0 - 36.0 g/dL   RDW 19.114.2 47.811.5 - 29.515.5 %   Platelets 262 150 - 400 K/uL   Neutrophils Relative % 85 %   Neutro Abs 16.4 (H) 1.7 - 7.7 K/uL   Lymphocytes Relative 10 %   Lymphs Abs 2.0 0.7 - 4.0 K/uL   Monocytes Relative 5 %   Monocytes Absolute 1.0 0.1 - 1.0 K/uL    Eosinophils Relative 0 %   Eosinophils Absolute 0.1 0.0 - 0.7 K/uL   Basophils Relative 0 %   Basophils Absolute 0.0 0.0 - 0.1 K/uL  Comprehensive metabolic panel  Result Value Ref Range   Sodium 141 135 - 145 mmol/L   Potassium 3.7 3.5 - 5.1 mmol/L   Chloride 106 101 - 111 mmol/L   CO2 31 22 - 32 mmol/L   Glucose, Bld 107 (H) 65 - 99 mg/dL   BUN 6 6 - 20 mg/dL   Creatinine, Ser 6.210.98 0.61 - 1.24 mg/dL   Calcium 9.2 8.9 - 30.810.3 mg/dL   Total Protein 8.1 6.5 - 8.1 g/dL   Albumin 4.3 3.5 - 5.0 g/dL   AST 14 (L) 15 - 41 U/L   ALT 13 (L) 17 - 63 U/L   Alkaline Phosphatase 81 38 - 126 U/L   Total Bilirubin 0.4 0.3 - 1.2 mg/dL   GFR calc non Af Amer >60 >  60 mL/min   GFR calc Af Amer >60 >60 mL/min   Anion gap 4 (L) 5 - 15   Dg Chest 2 View 02/06/2016  CLINICAL DATA:  Pain in the left side of the neck and jaw, 6 hours duration. History of previous myocardial infarction. EXAM: CHEST  2 VIEW COMPARISON:  10/17/2015 FINDINGS: Heart size is normal. Mediastinal shadows are normal. The lungs are clear. No bronchial thickening. No infiltrate, mass, effusion or collapse. Pulmonary vascularity is normal. No bony abnormality. IMPRESSION: Normal chest Electronically Signed   By: Paulina Fusi M.D.   On: 02/06/2016 21:53   Ct Maxillofacial Wo Cm 02/06/2016  CLINICAL DATA:  Left lower jaw pain after waking up this afternoon. No known trauma. EXAM: CT MAXILLOFACIAL WITHOUT CONTRAST TECHNIQUE: Multidetector CT imaging of the maxillofacial structures was performed. Multiplanar CT image reconstructions were also generated. A small metallic BB was placed on the right temple in order to reliably differentiate right from left. COMPARISON:  02/05/2008 FINDINGS: Osseous: No fracture or mandibular dislocation. No destructive process. Extractions at the sites of previously seen periapical erosions of the bilateral mandible. No periapical erosion or soft tissue inflammation is seen today. Supernumerary impacted teeth in the  bilateral mandible between the premolars and first molars. Orbits: Negative No traumatic or inflammatory findings. Sinuses: Mild diffuse mucosal thickening in the paranasal sinuses. No fluid level. Soft tissues: Diffuse prominence of tonsillar tissue from usually reactive in the setting. No signs of salivary inflammation or stone. Limited intracranial: No significant or unexpected finding. IMPRESSION: 1. No specific explanation for left jaw pain. 2. Prominent tonsils, correlate for URI. Electronically Signed   By: Marnee Spring M.D.   On: 02/06/2016 22:28    2230:  WBC count has been elevated previously. No outward signs of infection and no fever today. Workup otherwise reassuring. Does have hx of elevated troponin (was in the setting of SVT and excessive caffeine use); so will check 2nd troponin at 0300.   2350:  Sign out to Dr. Lynelle Doctor.    Samuel Jester, DO 02/06/16 2353

## 2016-02-06 NOTE — Discharge Instructions (Signed)
Take the prescriptions as directed.  Call your regular medical doctor and your Dentist on Monday to schedule a follow up appointment within the next 3 to 4 days.  Return to the Emergency Department immediately sooner if worsening.

## 2016-02-06 NOTE — ED Notes (Signed)
Pt c/o pain to left lower jaw after waking up from a nap.

## 2016-02-07 LAB — TROPONIN I: Troponin I: <0.03 ng/mL (ref <0.03)

## 2016-02-07 MED ORDER — NAPROXEN 250 MG PO TABS
500.0000 mg | ORAL_TABLET | Freq: Once | ORAL | Status: AC
  Administered 2016-02-07: 500 mg via ORAL
  Filled 2016-02-07: qty 2

## 2016-02-07 NOTE — ED Provider Notes (Signed)
Patient states he's had pain in his left jaw area since about 3 PM today. He has been using heat on it with some minor improvement. He denies pain with chewing or with hot or cold foods or liquids. He states sometimes when he breathes and it hurts.  On exam patient is missing #16, he has a large cavity in #15. His bottom molars appear normal.  Patient was left at change of shift to get a delta troponin. His second troponin is negative. Discussed with patient I think the source of his pain is this large cavity in his upper molar on the left. He does not have a dentist. He will be given a dental information sheet.   Results for orders placed or performed during the hospital encounter of 02/06/16  Troponin I  Result Value Ref Range   Troponin I <0.03 <0.03 ng/mL  CBC with Differential  Result Value Ref Range   WBC 19.4 (H) 4.0 - 10.5 K/uL   RBC 4.75 4.22 - 5.81 MIL/uL   Hemoglobin 14.4 13.0 - 17.0 g/dL   HCT 95.9 74.7 - 18.5 %   MCV 90.5 78.0 - 100.0 fL   MCH 30.3 26.0 - 34.0 pg   MCHC 33.5 30.0 - 36.0 g/dL   RDW 50.1 58.6 - 82.5 %   Platelets 262 150 - 400 K/uL   Neutrophils Relative % 85 %   Neutro Abs 16.4 (H) 1.7 - 7.7 K/uL   Lymphocytes Relative 10 %   Lymphs Abs 2.0 0.7 - 4.0 K/uL   Monocytes Relative 5 %   Monocytes Absolute 1.0 0.1 - 1.0 K/uL   Eosinophils Relative 0 %   Eosinophils Absolute 0.1 0.0 - 0.7 K/uL   Basophils Relative 0 %   Basophils Absolute 0.0 0.0 - 0.1 K/uL  Comprehensive metabolic panel  Result Value Ref Range   Sodium 141 135 - 145 mmol/L   Potassium 3.7 3.5 - 5.1 mmol/L   Chloride 106 101 - 111 mmol/L   CO2 31 22 - 32 mmol/L   Glucose, Bld 107 (H) 65 - 99 mg/dL   BUN 6 6 - 20 mg/dL   Creatinine, Ser 7.49 0.61 - 1.24 mg/dL   Calcium 9.2 8.9 - 35.5 mg/dL   Total Protein 8.1 6.5 - 8.1 g/dL   Albumin 4.3 3.5 - 5.0 g/dL   AST 14 (L) 15 - 41 U/L   ALT 13 (L) 17 - 63 U/L   Alkaline Phosphatase 81 38 - 126 U/L   Total Bilirubin 0.4 0.3 - 1.2 mg/dL    GFR calc non Af Amer >60 >60 mL/min   GFR calc Af Amer >60 >60 mL/min   Anion gap 4 (L) 5 - 15  Troponin I  Result Value Ref Range   Troponin I <0.03 <0.03 ng/mL   Dg Chest 2 View  02/06/2016  CLINICAL DATA:  Pain in the left side of the neck and jaw, 6 hours duration. History of previous myocardial infarction. EXAM: CHEST  2 VIEW COMPARISON:  10/17/2015 FINDINGS: Heart size is normal. Mediastinal shadows are normal. The lungs are clear. No bronchial thickening. No infiltrate, mass, effusion or collapse. Pulmonary vascularity is normal. No bony abnormality. IMPRESSION: Normal chest Electronically Signed   By: Paulina Fusi M.D.   On: 02/06/2016 21:53   Ct Maxillofacial Wo Cm  02/06/2016  CLINICAL DATA:  Left lower jaw pain after waking up this afternoon. No known trauma. EXAM: CT MAXILLOFACIAL WITHOUT CONTRAST TECHNIQUE: Multidetector CT imaging of the  maxillofacial structures was performed. Multiplanar CT image reconstructions were also generated. A small metallic BB was placed on the right temple in order to reliably differentiate right from left. COMPARISON:  02/05/2008 FINDINGS: Osseous: No fracture or mandibular dislocation. No destructive process. Extractions at the sites of previously seen periapical erosions of the bilateral mandible. No periapical erosion or soft tissue inflammation is seen today. Supernumerary impacted teeth in the bilateral mandible between the premolars and first molars. Orbits: Negative No traumatic or inflammatory findings. Sinuses: Mild diffuse mucosal thickening in the paranasal sinuses. No fluid level. Soft tissues: Diffuse prominence of tonsillar tissue from usually reactive in the setting. No signs of salivary inflammation or stone. Limited intracranial: No significant or unexpected finding. IMPRESSION: 1. No specific explanation for left jaw pain. 2. Prominent tonsils, correlate for URI. Electronically Signed   By: Marnee Spring M.D.   On: 02/06/2016 22:28     Diagnoses that have been ruled out:  None  Diagnoses that are still under consideration:  None  Final diagnoses:  Jaw pain   New Prescriptions   NAPROXEN (NAPROSYN) 250 MG TABLET    Take 1 tablet (250 mg total) by mouth 2 (two) times daily as needed for mild pain or moderate pain (take with food).   PENICILLIN V POTASSIUM (VEETID) 250 MG TABLET    Take 1 tablet (250 mg total) by mouth 4 (four) times daily.     Plan discharge  Devoria Albe, MD, Concha Pyo, MD 02/07/16 [id number]

## 2016-02-09 NOTE — Progress Notes (Signed)
Unable to send OV to PCP.  PCP wrong in system

## 2016-02-11 ENCOUNTER — Encounter: Payer: Self-pay | Admitting: Orthopaedic Surgery

## 2016-02-11 ENCOUNTER — Ambulatory Visit (INDEPENDENT_AMBULATORY_CARE_PROVIDER_SITE_OTHER): Payer: Self-pay | Admitting: Orthopaedic Surgery

## 2016-02-11 VITALS — BP 118/81 | HR 67 | Temp 97.5°F | Ht 70.0 in | Wt 148.2 lb

## 2016-02-11 DIAGNOSIS — I214 Non-ST elevation (NSTEMI) myocardial infarction: Secondary | ICD-10-CM

## 2016-02-11 DIAGNOSIS — M25512 Pain in left shoulder: Secondary | ICD-10-CM

## 2016-02-11 MED ORDER — HYDROCODONE-ACETAMINOPHEN 7.5-325 MG PO TABS: 1.0000 | ORAL_TABLET | ORAL | 0 refills | Status: DC | PRN

## 2016-02-11 NOTE — Progress Notes (Signed)
CC:  My shoulder is still hurting  He has pain of the left shoulder on a chronic basis. He has good and bad days.  He has no paresthesias or trauma.  He is doing his exercises.  He has heart problems, hypertension and irregular heart beats.  He is followed by cardiology.  ROM of the left shoulder is full but tender in the extremes today.  He has pain to resisted abduction.  NV intact.  Encounter Diagnoses  Name Primary?  . Left shoulder pain Yes  . NSTEMI (non-ST elevated myocardial infarction) (HCC)    Return in three months.  Continue exercises.  Call if any problem.  Electronically Signed Darreld Mclean, MD 7/26/20179:14 AM

## 2016-03-10 ENCOUNTER — Telehealth: Payer: Self-pay | Admitting: Orthopaedic Surgery

## 2016-03-10 MED ORDER — HYDROCODONE-ACETAMINOPHEN 7.5-325 MG PO TABS: 1.0000 | ORAL_TABLET | ORAL | 0 refills | Status: DC | PRN

## 2016-03-10 NOTE — Telephone Encounter (Signed)
Hydrocodone-Acetaminophen 7.5/325mg Qty 120 Tablets °

## 2016-04-07 ENCOUNTER — Telehealth: Payer: Self-pay | Admitting: Orthopaedic Surgery

## 2016-04-07 MED ORDER — HYDROCODONE-ACETAMINOPHEN 7.5-325 MG PO TABS: 1.0000 | ORAL_TABLET | Freq: Four times a day (QID) | ORAL | 0 refills | Status: DC | PRN

## 2016-04-07 NOTE — Telephone Encounter (Signed)
Hydrocodone-Acetaminophen 7.5/325mg Qty 120 Tablets °

## 2016-05-05 ENCOUNTER — Telehealth: Payer: Self-pay | Admitting: Orthopaedic Surgery

## 2016-05-05 NOTE — Telephone Encounter (Signed)
Patient called for refill:  HYDROcodone-acetaminophen (NORCO) 7.5-325 MG tablet 100 tablet    - states has no insurance.  Also re-scheduled appt for 05/13/16 to 05/18/16 due to new job.

## 2016-05-06 MED ORDER — HYDROCODONE-ACETAMINOPHEN 7.5-325 MG PO TABS: 1.0000 | ORAL_TABLET | Freq: Four times a day (QID) | ORAL | 0 refills | Status: DC | PRN

## 2016-05-13 ENCOUNTER — Ambulatory Visit: Payer: Self-pay | Admitting: Orthopaedic Surgery

## 2016-05-18 ENCOUNTER — Ambulatory Visit (INDEPENDENT_AMBULATORY_CARE_PROVIDER_SITE_OTHER): Payer: Self-pay | Admitting: Orthopaedic Surgery

## 2016-05-18 ENCOUNTER — Encounter: Payer: Self-pay | Admitting: Orthopaedic Surgery

## 2016-05-18 VITALS — BP 117/82 | HR 72 | Temp 98.1°F | Ht 70.0 in | Wt 150.0 lb

## 2016-05-18 DIAGNOSIS — I214 Non-ST elevation (NSTEMI) myocardial infarction: Secondary | ICD-10-CM

## 2016-05-18 DIAGNOSIS — G8929 Other chronic pain: Secondary | ICD-10-CM

## 2016-05-18 DIAGNOSIS — M25512 Pain in left shoulder: Secondary | ICD-10-CM

## 2016-05-18 NOTE — Progress Notes (Signed)
[name]:Alan Curtis, male DOB:08/16/1985, 30 y.o. [id number]  Chief Complaint  Patient presents with  . Follow-up    LEFT SHOULDER PAIN    HPI  Alan Curtis is a 30 y.o. male who has chronic pain of the left shoulder and significant heart disease.  He has no acute shoulder pain.  He has no redness or trauma.  He is taking his medicine. HPI  Body mass index is 21.52 kg/m.  ROS  Review of Systems  HENT: Negative for congestion.   Respiratory: Positive for shortness of breath. Negative for cough.   Cardiovascular: Positive for chest pain and palpitations. Negative for leg swelling.  Endocrine: Positive for cold intolerance.  Musculoskeletal: Positive for arthralgias.  Allergic/Immunologic: Positive for environmental allergies.    Past Medical History:  Diagnosis Date  . Chest pain 09/09/2015  . Elevated troponin 09/09/2015  . KNEE PAIN 12/02/2008   Qualifier: Diagnosis of  By: Romeo AppleHarrison MD, Duffy RhodyStanley    . NSTEMI (non-ST elevated myocardial infarction) (HCC) 09/09/2015  . Pain in the chest   . Palpitations 09/09/2015  . Tobacco abuse 09/09/2015    Past Surgical History:  Procedure Laterality Date  . KNEE SURGERY      Family History  Problem Relation Age of Onset  . Hypertension Mother   . Hypertension Father   . Asthma Brother   . Hypertension Maternal Grandmother   . Diabetes Maternal Grandmother     Social History Social History  Substance Use Topics  . Smoking status: Current Every Day Smoker    Packs/day: 1.00    Types: Cigarettes  . Smokeless tobacco: Not on file  . Alcohol use No    No Known Allergies  Current Outpatient Prescriptions  Medication Sig Dispense Refill  . HYDROcodone-acetaminophen (NORCO) 7.5-325 MG tablet Take 1 tablet by mouth every 6 (six) hours as needed. 90 tablet 0  . metoprolol tartrate (LOPRESSOR) 50 MG tablet Take 1 tablet (50 mg total) by mouth 2 (two) times daily. 60 tablet 6  . naproxen (NAPROSYN) 250 MG tablet Take 1  tablet (250 mg total) by mouth 2 (two) times daily as needed for mild pain or moderate pain (take with food). 14 tablet 0  . penicillin v potassium (VEETID) 250 MG tablet Take 1 tablet (250 mg total) by mouth 4 (four) times daily. 20 tablet 0   No current facility-administered medications for this visit.      Physical Exam  Blood pressure 117/82, pulse 72, temperature 98.1 F (36.7 C), height 5\' 10"  (1.778 m), weight 150 lb (68 kg).  Constitutional: overall normal hygiene, normal nutrition, well developed, normal grooming, normal body habitus. Assistive device:none  Musculoskeletal: gait and station Limp none, muscle tone and strength are normal, no tremors or atrophy is present.  .  Neurological: coordination overall normal.  Deep tendon reflex/nerve stretch intact.  Sensation normal.  Cranial nerves II-XII intact.   Skin:   Normal overall no scars, lesions, ulcers or rashes. No psoriasis.  Psychiatric: Alert and oriented x 3.  Recent memory intact, remote memory unclear.  Normal mood and affect. Well groomed.  Good eye contact.  Cardiovascular: overall no swelling, no varicosities, no edema bilaterally, normal temperatures of the legs and arms, no clubbing, cyanosis and good capillary refill.  Lymphatic: palpation is normal.  He has full motion of the left shoulder today but pain in the extremes.  NV intact.  The patient has been educated about the nature of the problem(s) and counseled on treatment options.  The patient appeared to understand what I have discussed and is in agreement with it.  Encounter Diagnoses  Name Primary?  . Chronic left shoulder pain Yes  . NSTEMI (non-ST elevated myocardial infarction) [facility](HCC)     PLAN Call if any problems.  Precautions discussed.  Continue current medications.   Return to clinic 3 months   Electronically Signed Darreld McleanWayne Najae Filsaime, MD 10/31/201711:00 AM

## 2016-06-03 ENCOUNTER — Telehealth: Payer: Self-pay | Admitting: Orthopaedic Surgery

## 2016-06-03 ENCOUNTER — Other Ambulatory Visit: Payer: Self-pay | Admitting: *Deleted

## 2016-06-03 MED ORDER — HYDROCODONE-ACETAMINOPHEN 7.5-325 MG PO TABS: 1.0000 | ORAL_TABLET | Freq: Four times a day (QID) | ORAL | 0 refills | Status: DC | PRN

## 2016-06-03 NOTE — Telephone Encounter (Signed)
Hydrocodone-Acetaminophen  7.5/325mg   Qty 90 Tablets  Take 1 tablet by mouth every 6 (six) hours as needed.

## 2016-07-02 ENCOUNTER — Telehealth: Payer: Self-pay | Admitting: Orthopaedic Surgery

## 2016-07-02 NOTE — Telephone Encounter (Signed)
Hydrocodone-Acetaminophen  7.5/325mg  Qty 90 Tablets °

## 2016-07-06 MED ORDER — HYDROCODONE-ACETAMINOPHEN 7.5-325 MG PO TABS: 1.0000 | ORAL_TABLET | Freq: Four times a day (QID) | ORAL | 0 refills | Status: DC | PRN

## 2016-08-02 ENCOUNTER — Telehealth: Payer: Self-pay | Admitting: Orthopaedic Surgery

## 2016-08-02 NOTE — Telephone Encounter (Signed)
Hydrocodone-Acetaminophen  7.5/325mg  Qty  80 Tablets °

## 2016-08-03 MED ORDER — HYDROCODONE-ACETAMINOPHEN 7.5-325 MG PO TABS: 1.0000 | ORAL_TABLET | Freq: Four times a day (QID) | ORAL | 0 refills | Status: DC | PRN

## 2016-08-18 ENCOUNTER — Ambulatory Visit: Payer: Self-pay | Admitting: Orthopaedic Surgery

## 2016-08-24 ENCOUNTER — Ambulatory Visit (INDEPENDENT_AMBULATORY_CARE_PROVIDER_SITE_OTHER): Payer: Self-pay | Admitting: Orthopaedic Surgery

## 2016-08-24 ENCOUNTER — Encounter: Payer: Self-pay | Admitting: Orthopaedic Surgery

## 2016-08-24 VITALS — BP 122/83 | HR 93 | Temp 99.3°F | Ht 70.0 in | Wt 146.0 lb

## 2016-08-24 DIAGNOSIS — M25512 Pain in left shoulder: Secondary | ICD-10-CM

## 2016-08-24 DIAGNOSIS — G8929 Other chronic pain: Secondary | ICD-10-CM

## 2016-08-24 MED ORDER — HYDROCODONE-ACETAMINOPHEN 7.5-325 MG PO TABS: 1.0000 | ORAL_TABLET | Freq: Four times a day (QID) | ORAL | 0 refills | Status: DC | PRN

## 2016-08-24 NOTE — Progress Notes (Signed)
Patient Alan Curtis:JWJXBJ:Alan Curtis, male DOB:09/28/1985, 31 y.o. [id number]  Chief Complaint  Patient presents with  . Follow-up    Chronic pain left shoulder    HPI  [name] is a 31 y.o. male who has chronic pain of the left shoulder.  He has no new trauma,no paresthesias.  He is doing his exercises. His heart problems are stable right now. HPI  Body mass index is 20.95 kg/m.  ROS  Review of Systems  HENT: Negative for congestion.   Respiratory: Positive for shortness of breath. Negative for cough.   Cardiovascular: Positive for chest pain and palpitations. Negative for leg swelling.  Endocrine: Positive for cold intolerance.  Musculoskeletal: Positive for arthralgias.  Allergic/Immunologic: Positive for environmental allergies.    Past Medical History:  Diagnosis Date  . Chest pain 09/09/2015  . Elevated troponin 09/09/2015  . KNEE PAIN 12/02/2008   Qualifier: Diagnosis of  By: Romeo AppleHarrison MD, Duffy RhodyStanley    . NSTEMI (non-ST elevated myocardial infarction) (HCC) 09/09/2015  . Pain in the chest   . Palpitations 09/09/2015  . Tobacco abuse 09/09/2015    Past Surgical History:  Procedure Laterality Date  . KNEE SURGERY      Family History  Problem Relation Age of Onset  . Hypertension Mother   . Hypertension Father   . Asthma Brother   . Hypertension Maternal Grandmother   . Diabetes Maternal Grandmother     Social History Social History  Substance Use Topics  . Smoking status: Current Every Day Smoker    Packs/day: 1.00    Types: Cigarettes  . Smokeless tobacco: Not on file  . Alcohol use No    No Known Allergies  Current Outpatient Prescriptions  Medication Sig Dispense Refill  . HYDROcodone-acetaminophen (NORCO) 7.5-325 MG tablet Take 1 tablet by mouth every 6 (six) hours as needed. 90 tablet 0  . metoprolol tartrate (LOPRESSOR) 50 MG tablet Take 1 tablet (50 mg total) by mouth 2 (two) times daily. 60 tablet 6  . naproxen (NAPROSYN) 250 MG tablet Take 1  tablet (250 mg total) by mouth 2 (two) times daily as needed for mild pain or moderate pain (take with food). 14 tablet 0  . penicillin v potassium (VEETID) 250 MG tablet Take 1 tablet (250 mg total) by mouth 4 (four) times daily. 20 tablet 0   No current facility-administered medications for this visit.      Physical Exam  Blood pressure 122/83, pulse 93, temperature 99.3 F (37.4 C), height 5\' 10"  (1.778 m), weight 146 lb (66.2 kg).  Constitutional: overall normal hygiene, normal nutrition, well developed, normal grooming, normal body habitus. Assistive device:none  Musculoskeletal: gait and station Limp none, muscle tone and strength are normal, no tremors or atrophy is present.  .  Neurological: coordination overall normal.  Deep tendon reflex/nerve stretch intact.  Sensation normal.  Cranial nerves II-XII intact.   Skin:   Normal overall no scars, lesions, ulcers or rashes. No psoriasis.  Psychiatric: Alert and oriented x 3.  Recent memory intact, remote memory unclear.  Normal mood and affect. Well groomed.  Good eye contact.  Cardiovascular: overall no swelling, no varicosities, no edema bilaterally, normal temperatures of the legs and arms, no clubbing, cyanosis and good capillary refill.  Lymphatic: palpation is normal.  Examination of right Upper Extremity is done.  Inspection:   Overall:  Elbow non-tender without crepitus or defects, forearm non-tender without crepitus or defects, wrist non-tender without crepitus or defects, hand non-tender.    Shoulder:  with glenohumeral joint tenderness, without effusion.   Upper arm: without swelling and tenderness   Range of motion:   Overall:  Full range of motion of the elbow, full range of motion of wrist and full range of motion in fingers.   Shoulder:  left  165  degrees forward flexion; 100 degrees abduction; 30 degrees internal rotation, 30 degrees external rotation, 15 degrees extension, 40 degrees  adduction.   Stability:   Overall:  Shoulder, elbow and wrist stable   Strength and Tone:   Overall full shoulder muscles strength, full upper arm strength and normal upper arm bulk and tone.   The patient has been educated about the nature of the problem(s) and counseled on treatment options.  The patient appeared to understand what I have discussed and is in agreement with it.  Encounter Diagnosis  Name Primary?  . Chronic left shoulder pain Yes    PLAN Call if any problems.  Precautions discussed.  Continue current medications.   Return to clinic 3 months   I have reviewed the [facility] Controlled Substance Reporting System web site prior to prescribing narcotic medicine for this patient. Electronically Signed Darreld Mclean, MD 2/6/201810:42 AM

## 2016-09-27 ENCOUNTER — Telehealth: Payer: Self-pay | Admitting: Orthopaedic Surgery

## 2016-09-27 MED ORDER — HYDROCODONE-ACETAMINOPHEN 7.5-325 MG PO TABS: 1.0000 | ORAL_TABLET | Freq: Four times a day (QID) | ORAL | 0 refills | Status: DC | PRN

## 2016-09-27 NOTE — Telephone Encounter (Signed)
Hydrocodone-Acetaminophen  7.5/325mg   Qty  90 tablets

## 2016-10-27 ENCOUNTER — Telehealth: Payer: Self-pay | Admitting: Orthopaedic Surgery

## 2016-10-27 MED ORDER — HYDROCODONE-ACETAMINOPHEN 7.5-325 MG PO TABS: 1.0000 | ORAL_TABLET | Freq: Four times a day (QID) | ORAL | 0 refills | Status: DC | PRN

## 2016-10-27 NOTE — Telephone Encounter (Signed)
Patient requests refill on Hydrocodone/Acetaminophen 7.5-.325  mgs.   Qty  75       Sig: Take 1 tablet by mouth every 6 (six) hours as needed.

## 2016-11-23 ENCOUNTER — Encounter: Payer: Self-pay | Admitting: Orthopaedic Surgery

## 2016-11-23 ENCOUNTER — Ambulatory Visit (INDEPENDENT_AMBULATORY_CARE_PROVIDER_SITE_OTHER): Payer: Self-pay | Admitting: Orthopaedic Surgery

## 2016-11-23 VITALS — BP 132/84 | HR 80 | Ht 68.0 in | Wt 145.0 lb

## 2016-11-23 DIAGNOSIS — M25512 Pain in left shoulder: Secondary | ICD-10-CM

## 2016-11-23 DIAGNOSIS — G8929 Other chronic pain: Secondary | ICD-10-CM

## 2016-11-23 MED ORDER — HYDROCODONE-ACETAMINOPHEN 7.5-325 MG PO TABS: 1.0000 | ORAL_TABLET | Freq: Four times a day (QID) | ORAL | 0 refills | Status: DC | PRN

## 2016-11-23 MED ORDER — NAPROXEN 500 MG PO TABS: 500.0000 mg | ORAL_TABLET | Freq: Two times a day (BID) | ORAL | 5 refills | Status: DC

## 2016-11-23 NOTE — Progress Notes (Signed)
Alan Curtis:Alan Curtis, male DOB:28-Jun-1986, 31 y.o. UXL:[id number]  Chief Complaint  Alan presents with  . Shoulder Pain    left shoudler recheck-not doing any better    HPI  Alan Curtis is a 31 y.o. male who has chronic left shoulder pain.  He is stable.  He has no paresthesias or new trauma.  He is doing his exercises.  He was recently employed full time. HPI  Body mass index is 22.05 kg/m.  ROS  Review of Systems  HENT: Negative for congestion.   Respiratory: Positive for shortness of breath. Negative for cough.   Cardiovascular: Positive for chest pain and palpitations. Negative for leg swelling.  Endocrine: Positive for cold intolerance.  Musculoskeletal: Positive for arthralgias.  Allergic/Immunologic: Positive for environmental allergies.    Past Medical History:  Diagnosis Date  . Chest pain 09/09/2015  . Elevated troponin 09/09/2015  . KNEE PAIN 12/02/2008   Qualifier: Diagnosis of  By: Romeo Apple MD, Duffy Rhody    . NSTEMI (non-ST elevated myocardial infarction) (HCC) 09/09/2015  . Pain in the chest   . Palpitations 09/09/2015  . Tobacco abuse 09/09/2015    Past Surgical History:  Procedure Laterality Date  . KNEE SURGERY      Family History  Problem Relation Age of Onset  . Hypertension Mother   . Hypertension Father   . Asthma Brother   . Hypertension Maternal Grandmother   . Diabetes Maternal Grandmother     Social History Social History  Substance Use Topics  . Smoking status: Current Every Day Smoker    Packs/day: 1.00    Types: Cigarettes  . Smokeless tobacco: Not Curtis file  . Alcohol use No    No Known Allergies  Current Outpatient Prescriptions  Medication Sig Dispense Refill  . HYDROcodone-acetaminophen (NORCO) 7.5-325 MG tablet Take 1 tablet by mouth every 6 (six) hours as needed. 60 tablet 0  . metoprolol tartrate (LOPRESSOR) 50 MG tablet Take 1 tablet (50 mg total) by mouth 2 (two) times daily. 60 tablet 6  . naproxen (NAPROSYN)  500 MG tablet Take 1 tablet (500 mg total) by mouth 2 (two) times daily with a meal. 60 tablet 5  . penicillin v potassium (VEETID) 250 MG tablet Take 1 tablet (250 mg total) by mouth 4 (four) times daily. 20 tablet 0   No current facility-administered medications for this visit.      Physical Exam  Blood pressure 132/84, pulse 80, height 5\' 8"  (1.727 m), weight 145 lb (65.8 kg).  Constitutional: overall normal hygiene, normal nutrition, well developed, normal grooming, normal body habitus. Assistive device:none  Musculoskeletal: gait and station Limp none, muscle tone and strength are normal, no tremors or atrophy is present.  .  Neurological: coordination overall normal.  Deep tendon reflex/nerve stretch intact.  Sensation normal.  Cranial nerves II-XII intact.   Skin:   Normal overall no scars, lesions, ulcers or rashes. No psoriasis.  Psychiatric: Alert and oriented x 3.  Recent memory intact, remote memory unclear.  Normal mood and affect. Well groomed.  Good eye contact.  Cardiovascular: overall no swelling, no varicosities, no edema bilaterally, normal temperatures of the legs and arms, no clubbing, cyanosis and good capillary refill.  Lymphatic: palpation is normal.  Left shoulder has full motion but pain in the extremes.  NV intact.  The Alan has been educated about the nature of the problem(s) and counseled Curtis treatment options.  The Alan appeared to understand what I have discussed and is in agreement  with it.  Encounter Diagnosis  Name Primary?  . Chronic left shoulder pain Yes     PLAN Call if any problems.  Precautions discussed.  Continue current medications.   Return to clinic 3 months   I [facility] the [facility] web site prior to prescribing narcotic medicine for this Alan.  Electronically Signed Darreld McleanWayne Nely Dedmon, MD 5/8/201810:16 AM

## 2016-12-20 ENCOUNTER — Telehealth: Payer: Self-pay | Admitting: Orthopaedic Surgery

## 2016-12-20 MED ORDER — HYDROCODONE-ACETAMINOPHEN 7.5-325 MG PO TABS: 1.0000 | ORAL_TABLET | Freq: Four times a day (QID) | ORAL | 0 refills | Status: DC | PRN

## 2016-12-20 NOTE — Telephone Encounter (Signed)
Patient requests refill on Hydrocodone/Acetaminophen 7.5-325 mgs.   Qty  60  Sig: Take 1 tablet by mouth every 6 (six) hours as needed.

## 2017-01-12 ENCOUNTER — Telehealth: Payer: Self-pay | Admitting: Orthopaedic Surgery

## 2017-01-12 MED ORDER — HYDROCODONE-ACETAMINOPHEN 7.5-325 MG PO TABS: 1.0000 | ORAL_TABLET | Freq: Four times a day (QID) | ORAL | 0 refills | Status: DC | PRN

## 2017-01-12 NOTE — Telephone Encounter (Signed)
Patient requests a refill on Hydrocodone/Acetaminophen 7.5-325  Mgs.   Qty  40  Sig: Take 1 tablet by mouth every 6 (six) hours as needed. Must last 30 days.

## 2017-02-15 ENCOUNTER — Telehealth: Payer: Self-pay | Admitting: Orthopaedic Surgery

## 2017-02-15 MED ORDER — METOPROLOL TARTRATE 50 MG PO TABS: 50.0000 mg | ORAL_TABLET | Freq: Two times a day (BID) | ORAL | 6 refills | Status: DC

## 2017-02-15 NOTE — Telephone Encounter (Signed)
Patient called to request refill: (states tried online but non-working) HYDROcodone-acetaminophen (NORCO) 7.5-325 MG tablet 40 tablet

## 2017-02-17 MED ORDER — HYDROCODONE-ACETAMINOPHEN 7.5-325 MG PO TABS: 1.0000 | ORAL_TABLET | Freq: Four times a day (QID) | ORAL | 0 refills | Status: DC | PRN

## 2017-02-23 ENCOUNTER — Ambulatory Visit: Payer: Self-pay | Admitting: Orthopaedic Surgery

## 2017-03-02 ENCOUNTER — Ambulatory Visit: Payer: Self-pay | Admitting: Orthopaedic Surgery

## 2017-03-15 ENCOUNTER — Ambulatory Visit (INDEPENDENT_AMBULATORY_CARE_PROVIDER_SITE_OTHER): Payer: Self-pay | Admitting: Orthopaedic Surgery

## 2017-03-15 VITALS — BP 134/91 | HR 74 | Temp 97.2°F | Ht 68.0 in | Wt 140.0 lb

## 2017-03-15 DIAGNOSIS — I214 Non-ST elevation (NSTEMI) myocardial infarction: Secondary | ICD-10-CM

## 2017-03-15 DIAGNOSIS — G8929 Other chronic pain: Secondary | ICD-10-CM

## 2017-03-15 DIAGNOSIS — M25512 Pain in left shoulder: Secondary | ICD-10-CM

## 2017-03-15 MED ORDER — HYDROCODONE-ACETAMINOPHEN 7.5-325 MG PO TABS: 1.0000 | ORAL_TABLET | Freq: Four times a day (QID) | ORAL | 0 refills | Status: DC | PRN

## 2017-03-15 NOTE — Patient Instructions (Signed)
Steps to Quit Smoking Smoking tobacco can be bad for your health. It can also affect almost every organ in your body. Smoking puts you and people around you at risk for many serious [place]-lasting (chronic) diseases. Quitting smoking is hard, but it is one of the best things that you can do for your health. It is never too late to quit. What are the benefits of quitting smoking? When you quit smoking, you lower your risk for getting serious diseases and conditions. They can include:  Lung cancer or lung disease.  Heart disease.  Stroke.  Heart attack.  Not being able to have children (infertility).  Weak bones (osteoporosis) and broken bones (fractures).  If you have coughing, wheezing, and shortness of breath, those symptoms may get better when you quit. You may also get sick less often. If you are pregnant, quitting smoking can help to lower your chances of having a baby of low birth weight. What can I do to help me quit smoking? Talk with your doctor about what can help you quit smoking. Some things you can do (strategies) include:  Quitting smoking totally, instead of slowly cutting back how much you smoke over a period of time.  Going to in-person counseling. You are more likely to quit if you go to many counseling sessions.  Using resources and support systems, such as: ? Online chats with a counselor. ? Phone quitlines. ? Printed self-help materials. ? Support groups or group counseling. ? Text messaging programs. ? Mobile phone apps or applications.  Taking medicines. Some of these medicines may have nicotine in them. If you are pregnant or breastfeeding, do not take any medicines to quit smoking unless your doctor says it is okay. Talk with your doctor about counseling or other things that can help you.  Talk with your doctor about using more than one strategy at the same time, such as taking medicines while you are also going to in-person counseling. This can help make  quitting easier. What things can I do to make it easier to quit? Quitting smoking might feel very hard at first, but there is a lot that you can do to make it easier. Take these steps:  Talk to your family and friends. Ask them to support and encourage you.  Call phone quitlines, reach out to support groups, or work with a counselor.  Ask people who smoke to not smoke around you.  Avoid places that make you want (trigger) to smoke, such as: ? Bars. ? Parties. ? Smoke-break areas at work.  Spend time with people who do not smoke.  Lower the stress in your life. Stress can make you want to smoke. Try these things to help your stress: ? Getting regular exercise. ? Deep-breathing exercises. ? Yoga. ? Meditating. ? Doing a body scan. To do this, close your eyes, focus on one area of your body at a time from head to toe, and notice which parts of your body are tense. Try to relax the muscles in those areas.  Download or buy apps on your mobile phone or tablet that can help you stick to your quit plan. There are many free apps, such as QuitGuide from the CDC (Centers for Disease Control and Prevention). You can find more support from smokefree.gov and other websites.  This information is not intended to replace advice given to you by your health care provider. Make sure you discuss any questions you have with your health care provider. Document Released: 05/01/2009 Document   Revised: 03/02/2016 Document Reviewed: 11/19/2014 Elsevier Interactive Patient Education  2018 [employer].  

## 2017-03-15 NOTE — Progress Notes (Signed)
Patient Alan Curtis, male DOB:July 14, 1986, 31 y.o. [id number]  Chief Complaint  Patient presents with  . Follow-up    Shoulder pain    HPI  Alan FINIGAN is a 31 y.o. male who has chronic left shoulder pain.  He has no new trauma.  He has no numbness. He has pain with overhead lifting.  He is working regularly. HPI  Body mass index is 21.29 kg/m.  ROS  Review of Systems  Past Medical History:  Diagnosis Date  . Chest pain 09/09/2015  . Elevated troponin 09/09/2015  . KNEE PAIN 12/02/2008   Qualifier: Diagnosis of  By: Romeo Apple MD, Duffy Rhody    . NSTEMI (non-ST elevated myocardial infarction) (HCC) 09/09/2015  . Pain in the chest   . Palpitations 09/09/2015  . Tobacco abuse 09/09/2015    Past Surgical History:  Procedure Laterality Date  . KNEE SURGERY      Family History  Problem Relation Age of Onset  . Hypertension Mother   . Hypertension Father   . Asthma Brother   . Hypertension Maternal Grandmother   . Diabetes Maternal Grandmother     Social History Social History  Substance Use Topics  . Smoking status: Current Every Day Smoker    Packs/day: 1.00    Types: Cigarettes  . Smokeless tobacco: Not on file  . Alcohol use No    No Known Allergies  Current Outpatient Prescriptions  Medication Sig Dispense Refill  . HYDROcodone-acetaminophen (NORCO) 7.5-325 MG tablet Take 1 tablet by mouth every 6 (six) hours as needed. Must last 30 days. 40 tablet 0  . metoprolol tartrate (LOPRESSOR) 50 MG tablet Take 1 tablet (50 mg total) by mouth 2 (two) times daily. 60 tablet 6  . naproxen (NAPROSYN) 500 MG tablet Take 1 tablet (500 mg total) by mouth 2 (two) times daily with a meal. 60 tablet 5  . penicillin v potassium (VEETID) 250 MG tablet Take 1 tablet (250 mg total) by mouth 4 (four) times daily. 20 tablet 0   No current facility-administered medications for this visit.      Physical Exam  Blood pressure (!) 134/91, pulse 74, temperature (!) 97.2 F  (36.2 C), height 5\' 8"  (1.727 m), weight 140 lb (63.5 kg).  Constitutional: overall normal hygiene, normal nutrition, well developed, normal grooming, normal body habitus. Assistive device:none  Musculoskeletal: gait and station Limp none, muscle tone and strength are normal, no tremors or atrophy is present.  .  Neurological: coordination overall normal.  Deep tendon reflex/nerve stretch intact.  Sensation normal.  Cranial nerves II-XII intact.   Skin:   Normal overall no scars, lesions, ulcers or rashes. No psoriasis.  Psychiatric: Alert and oriented x 3.  Recent memory intact, remote memory unclear.  Normal mood and affect. Well groomed.  Good eye contact.  Cardiovascular: overall no swelling, no varicosities, no edema bilaterally, normal temperatures of the legs and arms, no clubbing, cyanosis and good capillary refill.  Lymphatic: palpation is normal.  Examination of left Upper Extremity is done.  Inspection:   Overall:  Elbow non-tender without crepitus or defects, forearm non-tender without crepitus or defects, wrist non-tender without crepitus or defects, hand non-tender.    Shoulder: with glenohumeral joint tenderness, without effusion.   Upper arm: without swelling and tenderness   Range of motion:   Overall:  Full range of motion of the elbow, full range of motion of wrist and full range of motion in fingers.   Shoulder:  left  165 degrees forward  flexion; 145 degrees abduction; 35 degrees internal rotation, 35 degrees external rotation, 20 degrees extension, 40 degrees adduction.   Stability:   Overall:  Shoulder, elbow and wrist stable   Strength and Tone:   Overall full shoulder muscles strength, full upper arm strength and normal upper arm bulk and tone.  The patient has been educated about the nature of the problem(s) and counseled on treatment options.  The patient appeared to understand what I have discussed and is in agreement with it.  Encounter Diagnoses   Name Primary?  . Chronic left shoulder pain Yes  . NSTEMI (non-ST elevated myocardial infarction) [facility])     PLAN Call if any problems.  Precautions discussed.  Continue current medications.   Return to clinic 3 months   I have reviewed the [facility] Controlled Substance Reporting System web site prior to prescribing narcotic medicine for this patient.  Electronically Signed Darreld Mclean, MD 8/28/20188:48 AM

## 2017-03-16 ENCOUNTER — Ambulatory Visit: Payer: Self-pay | Admitting: Orthopaedic Surgery

## 2017-04-18 ENCOUNTER — Other Ambulatory Visit: Payer: Self-pay | Admitting: Orthopedic Surgery

## 2017-04-18 ENCOUNTER — Telehealth: Payer: Self-pay | Admitting: Orthopaedic Surgery

## 2017-04-18 MED ORDER — HYDROCODONE-ACETAMINOPHEN 5-325 MG PO TABS: 1.0000 | ORAL_TABLET | Freq: Three times a day (TID) | ORAL | 0 refills | Status: DC | PRN

## 2017-04-18 NOTE — Telephone Encounter (Signed)
Hydrocodone-Acetaminophen  7.5/325mg   Qty 40 Tablets  Take 1 tablet by mouth every 6 (six) hours as needed. Must last 30 days.

## 2017-04-28 ENCOUNTER — Telehealth: Payer: Self-pay | Admitting: Orthopaedic Surgery

## 2017-04-28 MED ORDER — HYDROCODONE-ACETAMINOPHEN 5-325 MG PO TABS: 1.0000 | ORAL_TABLET | Freq: Three times a day (TID) | ORAL | 0 refills | Status: DC | PRN

## 2017-04-28 NOTE — Telephone Encounter (Signed)
Hydrocodone-Acetaminophen  5/325mg  Tablets

## 2017-05-06 ENCOUNTER — Telehealth: Payer: Self-pay | Admitting: Orthopaedic Surgery

## 2017-05-06 NOTE — Telephone Encounter (Signed)
Hydrocodone-Acetaminophen  5/325mg  Tablets  Qty 30 Tablets

## 2017-05-09 MED ORDER — HYDROCODONE-ACETAMINOPHEN 5-325 MG PO TABS: 1.0000 | ORAL_TABLET | Freq: Three times a day (TID) | ORAL | 0 refills | Status: DC | PRN

## 2017-05-13 ENCOUNTER — Emergency Department (HOSPITAL_COMMUNITY): Payer: 59

## 2017-05-13 ENCOUNTER — Encounter (HOSPITAL_COMMUNITY): Payer: Self-pay | Admitting: *Deleted

## 2017-05-13 ENCOUNTER — Emergency Department (HOSPITAL_COMMUNITY)
Admission: EM | Admit: 2017-05-13 | Discharge: 2017-05-14 | Disposition: A | Discharge status: Home / Self Care | Payer: 59 | Attending: Emergency Medicine | Admitting: Emergency Medicine

## 2017-05-13 DIAGNOSIS — F17210 Nicotine dependence, cigarettes, uncomplicated: Secondary | ICD-10-CM | POA: Diagnosis not present

## 2017-05-13 DIAGNOSIS — I471 Supraventricular tachycardia: Secondary | ICD-10-CM

## 2017-05-13 DIAGNOSIS — Z79899 Other long term (current) drug therapy: Secondary | ICD-10-CM | POA: Insufficient documentation

## 2017-05-13 DIAGNOSIS — R0789 Other chest pain: Secondary | ICD-10-CM | POA: Diagnosis present

## 2017-05-13 HISTORY — DX: Supraventricular tachycardia: I47.1

## 2017-05-13 HISTORY — DX: Supraventricular tachycardia, unspecified: I47.10

## 2017-05-13 LAB — COMPREHENSIVE METABOLIC PANEL
ALBUMIN: 4.7 g/dL (ref 3.5–5.0)
ALT: 23 U/L (ref 17–63)
ANION GAP: 13 (ref 5–15)
AST: 36 U/L (ref 15–41)
Alkaline Phosphatase: 94 U/L (ref 38–126)
BILIRUBIN TOTAL: 0.5 mg/dL (ref 0.3–1.2)
BUN: 13 mg/dL (ref 6–20)
CHLORIDE: 106 mmol/L (ref 101–111)
CO2: 25 mmol/L (ref 22–32)
Calcium: 9.5 mg/dL (ref 8.9–10.3)
Creatinine, Ser: 1.3 mg/dL — ABNORMAL HIGH (ref 0.61–1.24)
GFR calc Af Amer: >60 mL/min (ref >60)
GLUCOSE: 121 mg/dL — AB (ref 65–99)
POTASSIUM: 3.7 mmol/L (ref 3.5–5.1)
Sodium: 144 mmol/L (ref 135–145)
TOTAL PROTEIN: 8.7 g/dL — AB (ref 6.5–8.1)

## 2017-05-13 LAB — CBC WITH DIFFERENTIAL/PLATELET
BASOS ABS: 0 10*3/uL (ref 0.0–0.1)
Basophils Relative: 0 %
EOS ABS: 0.1 10*3/uL (ref 0.0–0.7)
EOS PCT: 1 %
HEMATOCRIT: 44.6 % (ref 39.0–52.0)
Hemoglobin: 15.1 g/dL (ref 13.0–17.0)
LYMPHS PCT: 22 %
Lymphs Abs: 4.1 10*3/uL — ABNORMAL HIGH (ref 0.7–4.0)
MCH: 30.9 pg (ref 26.0–34.0)
MCHC: 33.9 g/dL (ref 30.0–36.0)
MCV: 91.2 fL (ref 78.0–100.0)
MONO ABS: 1.1 10*3/uL — AB (ref 0.1–1.0)
Monocytes Relative: 6 %
Neutro Abs: 13.3 10*3/uL — ABNORMAL HIGH (ref 1.7–7.7)
Neutrophils Relative %: 71 %
PLATELETS: 282 10*3/uL (ref 150–400)
RBC: 4.89 MIL/uL (ref 4.22–5.81)
RDW: 14.2 % (ref 11.5–15.5)
WBC: 18.7 10*3/uL — AB (ref 4.0–10.5)

## 2017-05-13 LAB — RAPID URINE DRUG SCREEN, HOSP PERFORMED
Amphetamines: NOT DETECTED
BENZODIAZEPINES: NOT DETECTED
Barbiturates: NOT DETECTED
COCAINE: NOT DETECTED
Opiates: POSITIVE — AB
Tetrahydrocannabinol: POSITIVE — AB

## 2017-05-13 LAB — I-STAT TROPONIN, ED: Troponin i, poc: 0 ng/mL (ref 0.00–0.08)

## 2017-05-13 LAB — TROPONIN I

## 2017-05-13 MED ORDER — ADENOSINE 6 MG/2ML IV SOLN
INTRAVENOUS | Status: AC
  Filled 2017-05-13: qty 2

## 2017-05-13 MED ORDER — ADENOSINE 6 MG/2ML IV SOLN
6.0000 mg | Freq: Once | INTRAVENOUS | Status: AC
  Administered 2017-05-13: 6 mg via INTRAVENOUS

## 2017-05-13 MED ORDER — SODIUM CHLORIDE 0.9 % IV BOLUS (SEPSIS)
500.0000 mL | Freq: Once | INTRAVENOUS | Status: AC
  Administered 2017-05-13: 500 mL via INTRAVENOUS

## 2017-05-13 MED ORDER — ADENOSINE 6 MG/2ML IV SOLN
12.0000 mg | Freq: Once | INTRAVENOUS | Status: AC
  Administered 2017-05-13: 12 mg via INTRAVENOUS

## 2017-05-13 NOTE — ED Triage Notes (Signed)
Pt reports being at work and taking out the trash. Pt states he began having palpitations and chest pain. Pt has a hx of pa;pitations.

## 2017-05-13 NOTE — ED Provider Notes (Signed)
[facility] EMERGENCY DEPARTMENT Provider Note   CSN: [id number] Arrival date & time: 05/13/17  2218     History   Chief Complaint No chief complaint on file.   HPI Alan Curtis is a 31 y.o. male.  The history is provided by the patient. No language interpreter was used.  Chest Pain   This is a new problem. The current episode started less than 1 hour ago. The problem occurs constantly. The problem has been gradually worsening. The pain is present in the substernal region. The pain is moderate. The quality of the pain is described as heavy. The pain does not radiate. Duration of episode(s) is 1 hour. Pertinent negatives include no nausea and no shortness of breath. He has tried nothing for the symptoms. The treatment provided no relief. There are no known risk factors.  His past medical history is significant for MI.  His family medical history is significant for diabetes.  Procedure history is positive for cardiac catheterization and echocardiogram.   Pt has a history of SVT two times in the past.  Pt had a NStemi  In the past.  Pt reports heart began racing tonight.  Pt attempted vagal maneuvers without relief  Past Medical History:  Diagnosis Date  . Chest pain 09/09/2015  . Elevated troponin 09/09/2015  . KNEE PAIN 12/02/2008   Qualifier: Diagnosis of  By: Romeo Apple MD, Duffy Rhody    . NSTEMI (non-ST elevated myocardial infarction) (HCC) 09/09/2015  . Pain in the chest   . Palpitations 09/09/2015  . SVT (supraventricular tachycardia) (HCC)   . Tobacco abuse 09/09/2015    Patient Active Problem List   Diagnosis Date Noted  . Pain in the chest   . NSTEMI (non-ST elevated myocardial infarction) (HCC) 09/09/2015  . Chest pain 09/09/2015  . Elevated troponin 09/09/2015  . Palpitations 09/09/2015  . Tobacco abuse 09/09/2015  . KNEE PAIN 12/02/2008    Past Surgical History:  Procedure Laterality Date  . KNEE SURGERY         Home Medications    Prior to Admission  medications   Medication Sig Start Date End Date Taking? Authorizing Provider  HYDROcodone-acetaminophen (NORCO) 5-325 MG tablet Take 1 tablet by mouth every 8 (eight) hours as needed for moderate pain. 05/09/17   Darreld Mclean, MD  metoprolol tartrate (LOPRESSOR) 50 MG tablet Take 1 tablet (50 mg total) by mouth 2 (two) times daily. 02/15/17   Darreld Mclean, MD  naproxen (NAPROSYN) 500 MG tablet Take 1 tablet (500 mg total) by mouth 2 (two) times daily with a meal. 11/23/16   Darreld Mclean, MD  penicillin v potassium (VEETID) 250 MG tablet Take 1 tablet (250 mg total) by mouth 4 (four) times daily. 02/06/16   Samuel Jester, DO    Family History Family History  Problem Relation Age of Onset  . Hypertension Mother   . Hypertension Father   . Asthma Brother   . Hypertension Maternal Grandmother   . Diabetes Maternal Grandmother     Social History Social History  Substance Use Topics  . Smoking status: Current Every Day Smoker    Packs/day: 0.50    Types: Cigarettes  . Smokeless tobacco: Never Used  . Alcohol use No     Allergies   Patient has no known allergies.   Review of Systems Review of Systems  Respiratory: Negative for shortness of breath.   Cardiovascular: Positive for chest pain.  Gastrointestinal: Negative for nausea.  All other systems reviewed and are negative.  Physical Exam Updated Vital Signs BP (!) 91/52   Pulse (!) 119   Temp 97.8 F (36.6 C) (Oral)   Resp (!) 28   Ht 5\' 8"  (1.727 m)   Wt 65.8 kg (145 lb)   SpO2 100%   BMI 22.05 kg/m   Physical Exam  Constitutional: He appears well-developed and well-nourished.  HENT:  Head: Normocephalic and atraumatic.  Right Ear: External ear normal.  Left Ear: External ear normal.  Mouth/Throat: Oropharynx is clear and moist.  Eyes: Pupils are equal, round, and reactive to light. Conjunctivae and EOM are normal.  Neck: Normal range of motion. Neck supple.  Cardiovascular:  No murmur  heard. Heart rate 240's   Pulmonary/Chest: Effort normal and breath sounds normal. No respiratory distress.  Abdominal: Soft. There is no tenderness.  Musculoskeletal: He exhibits no edema.  Neurological: He is alert.  Skin: Skin is warm and dry.  Psychiatric: He has a normal mood and affect.  Nursing note and vitals reviewed.    ED Treatments / Results  Labs (all labs ordered are listed, but only abnormal results are displayed) Labs Reviewed  CBC WITH DIFFERENTIAL/PLATELET  COMPREHENSIVE METABOLIC PANEL  TROPONIN I  RAPID URINE DRUG SCREEN, HOSP PERFORMED    EKG  EKG Interpretation None       Radiology No results found.  Procedures .Critical Care Performed by: Elson Areas Authorized by: Elson Areas   Critical care provider statement:    Critical care start time:  05/13/2017 10:30 PM   Critical care time was exclusive of:  Separately billable procedures and treating other patients   Critical care was necessary to treat or prevent imminent or life-threatening deterioration of the following conditions:  Cardiac failure and circulatory failure   Critical care was time spent personally by me on the following activities:  Ordering and performing treatments and interventions, ordering and review of laboratory studies, ordering and review of radiographic studies, pulse oximetry, re-evaluation of patient's condition, review of old charts, examination of patient, evaluation of patient's response to treatment, discussions with primary provider, discussions with consultants, development of treatment plan with patient or surrogate and blood draw for specimens Comments:     I attempted vagal movers including cold water, cough, straining.   Dr. Adriana Simas attempted carotid massage.  Pt given adenosine 6 mg Iv with no rate change.  Pt given 12 mg of adenosine with return to normal sinus.   Rate in the 70's     (including critical care time)  Medications Ordered in ED Medications   sodium chloride 0.9 % bolus 500 mL (not administered)  adenosine (ADENOCARD) 6 MG/2ML injection 6 mg (6 mg Intravenous Given 05/13/17 2245)  adenosine (ADENOCARD) 6 MG/2ML injection 12 mg (12 mg Intravenous Given 05/13/17 2251)     Initial Impression / Assessment and Plan / ED Course  I have reviewed the triage vital signs and the nursing notes.  Pertinent labs & imaging results that were available during my care of the patient were reviewed by me and considered in my medical decision making (see chart for details).     I spoke to Dr. Charlestine Night Cardiology.  I will repeat troponin.  I troponin is normal pt advised to follow up with cardiology.  Dr. Charlestine Night will let pt's cardiologist know that pt needs an appointment.    Final Clinical Impressions(s) / ED Diagnoses   Final diagnoses:  None    New Prescriptions New Prescriptions   No medications on file  Elson AreasSofia, Yassir Enis K, PA-C 05/14/17 0053    Donnetta Hutchingook, Brian, MD 05/14/17 [phone]

## 2017-05-14 LAB — TROPONIN I: Troponin I: 0.08 ng/mL (ref <0.03)

## 2017-05-14 NOTE — ED Notes (Signed)
Pt sleeping at this time.

## 2017-05-14 NOTE — ED Notes (Signed)
[facility] canceled per Dr. Judd Lienelo.

## 2017-05-14 NOTE — ED Notes (Signed)
Spoke with Selena BattenKim with [facility] paging on call Cardiology for elevated troponin.

## 2017-05-14 NOTE — ED Provider Notes (Signed)
Care assumed from Dr. Adriana Simasook and Trisha MangleKaren Sophia at shift change.  Patient presented here with SVT with a rate of 240 and was converted with adenosine.  His initial troponin was 0.03, then was repeated and resulted at 0.08.  The patient is having no symptoms and feels fine.  Upon reviewing his prior record, he developed a positive troponin in March 2017 after an episode of SVT.  This was discussed with cardiology and they felt that this was to be expected given the degree of his tachycardia.  I highly doubt an acute coronary artery occlusion and suspect his troponin bump was rate related.  I feel as though he is appropriate for discharge, to return as needed and follow-up with his cardiologist in the near future.   Geoffery Lyonselo, Kenroy Timberman, MD 05/14/17 [phone]

## 2017-05-14 NOTE — Discharge Instructions (Signed)
Follow-up this week with your cardiologist, and return to the ER if symptoms significantly worsen or change.

## 2017-05-14 NOTE — ED Notes (Signed)
Trop 0.08 Md made aware.

## 2017-05-19 ENCOUNTER — Telehealth: Payer: Self-pay | Admitting: Orthopaedic Surgery

## 2017-05-19 MED ORDER — HYDROCODONE-ACETAMINOPHEN 5-325 MG PO TABS: 1.0000 | ORAL_TABLET | Freq: Three times a day (TID) | ORAL | 0 refills | Status: DC | PRN

## 2017-05-19 NOTE — Telephone Encounter (Signed)
Hydrocodone-Acetaminophen  5/325 mg  Qty 30 Tablets °

## 2017-05-30 ENCOUNTER — Telehealth: Payer: Self-pay | Admitting: Orthopaedic Surgery

## 2017-05-30 MED ORDER — HYDROCODONE-ACETAMINOPHEN 5-325 MG PO TABS: 1.0000 | ORAL_TABLET | Freq: Three times a day (TID) | ORAL | 0 refills | Status: DC | PRN

## 2017-05-30 NOTE — Telephone Encounter (Signed)
Patient requests refill on Hydrocodone/Aetaminophen 5-325  Mgs.  Qty  30  Sig: Take 1 tablet by mouth every 8 (eight) hours as needed for moderate pain.

## 2017-06-06 ENCOUNTER — Telehealth: Payer: Self-pay | Admitting: Orthopedic Surgery

## 2017-06-06 NOTE — Telephone Encounter (Signed)
Patient requests refill on Hydrocodone/Acetaminophen 5-325   MgsSig: Take 1 tablet every 8 (eight) hours as needed by mouth for moderate pain.Rob Bunting.   Qty  30

## 2017-06-07 MED ORDER — HYDROCODONE-ACETAMINOPHEN 5-325 MG PO TABS: 1.0000 | ORAL_TABLET | Freq: Three times a day (TID) | ORAL | 0 refills | Status: DC | PRN

## 2017-06-15 ENCOUNTER — Ambulatory Visit: Payer: 59 | Admitting: Orthopaedic Surgery

## 2017-06-15 ENCOUNTER — Encounter: Payer: Self-pay | Admitting: Orthopaedic Surgery

## 2017-06-15 VITALS — BP 135/82 | HR 79 | Ht 68.0 in | Wt 147.0 lb

## 2017-06-15 DIAGNOSIS — M25512 Pain in left shoulder: Secondary | ICD-10-CM

## 2017-06-15 DIAGNOSIS — G8929 Other chronic pain: Secondary | ICD-10-CM | POA: Diagnosis not present

## 2017-06-15 DIAGNOSIS — F17210 Nicotine dependence, cigarettes, uncomplicated: Secondary | ICD-10-CM

## 2017-06-15 MED ORDER — HYDROCODONE-ACETAMINOPHEN 5-325 MG PO TABS: 1.0000 | ORAL_TABLET | Freq: Three times a day (TID) | ORAL | 0 refills | Status: DC | PRN

## 2017-06-15 NOTE — Patient Instructions (Signed)
Steps to Quit Smoking Smoking tobacco can be bad for your health. It can also affect almost every organ in your body. Smoking puts you and people around you at risk for many serious Starlene Consuegra-lasting (chronic) diseases. Quitting smoking is hard, but it is one of the best things that you can do for your health. It is never too late to quit. What are the benefits of quitting smoking? When you quit smoking, you lower your risk for getting serious diseases and conditions. They can include:  Lung cancer or lung disease.  Heart disease.  Stroke.  Heart attack.  Not being able to have children (infertility).  Weak bones (osteoporosis) and broken bones (fractures).  If you have coughing, wheezing, and shortness of breath, those symptoms may get better when you quit. You may also get sick less often. If you are pregnant, quitting smoking can help to lower your chances of having a baby of low birth weight. What can I do to help me quit smoking? Talk with your doctor about what can help you quit smoking. Some things you can do (strategies) include:  Quitting smoking totally, instead of slowly cutting back how much you smoke over a period of time.  Going to in-person counseling. You are more likely to quit if you go to many counseling sessions.  Using resources and support systems, such as: ? Online chats with a counselor. ? Phone quitlines. ? Printed self-help materials. ? Support groups or group counseling. ? Text messaging programs. ? Mobile phone apps or applications.  Taking medicines. Some of these medicines may have nicotine in them. If you are pregnant or breastfeeding, do not take any medicines to quit smoking unless your doctor says it is okay. Talk with your doctor about counseling or other things that can help you.  Talk with your doctor about using more than one strategy at the same time, such as taking medicines while you are also going to in-person counseling. This can help make  quitting easier. What things can I do to make it easier to quit? Quitting smoking might feel very hard at first, but there is a lot that you can do to make it easier. Take these steps:  Talk to your family and friends. Ask them to support and encourage you.  Call phone quitlines, reach out to support groups, or work with a counselor.  Ask people who smoke to not smoke around you.  Avoid places that make you want (trigger) to smoke, such as: ? Bars. ? Parties. ? Smoke-break areas at work.  Spend time with people who do not smoke.  Lower the stress in your life. Stress can make you want to smoke. Try these things to help your stress: ? Getting regular exercise. ? Deep-breathing exercises. ? Yoga. ? Meditating. ? Doing a body scan. To do this, close your eyes, focus on one area of your body at a time from head to toe, and notice which parts of your body are tense. Try to relax the muscles in those areas.  Download or buy apps on your mobile phone or tablet that can help you stick to your quit plan. There are many free apps, such as QuitGuide from the CDC (Centers for Disease Control and Prevention). You can find more support from smokefree.gov and other websites.  This information is not intended to replace advice given to you by your health care provider. Make sure you discuss any questions you have with your health care provider. Document Released: 05/01/2009 Document   Revised: 03/02/2016 Document Reviewed: 11/19/2014 Elsevier Interactive Patient Education  2018 Elsevier Inc.  

## 2017-06-15 NOTE — Progress Notes (Signed)
Patient NG:EXBMWU:Alan Curtis, male DOB:09/09/1985, 31 y.o. [id number]  Chief Complaint  Patient presents with  . Follow-up    left shoulder    HPI  Alan Curtis is a 31 y.o. male who has chronic pain of the left shoulder.  He is working full time now and has less pain.  He has good and bad days, more with the cold weather.  He has learned to wear warmer clothes.  He has no numbness.  He had a problem with the shoulder last week he feels is related to the weather more than activity.  He has heart disease that has limited him some.  He smokes.  He smokes a half a pack a day and has done so since he was 17.  I have told him with his heart problem, he should not smoke at all.  We had a long talk about this.  HPI  Body mass index is 22.35 kg/m.  ROS  Review of Systems  HENT: Negative for congestion.   Respiratory: Positive for shortness of breath. Negative for cough.   Cardiovascular: Positive for chest pain and palpitations. Negative for leg swelling.  Endocrine: Positive for cold intolerance.  Musculoskeletal: Positive for arthralgias.  Allergic/Immunologic: Positive for environmental allergies.  All other systems reviewed and are negative.   Past Medical History:  Diagnosis Date  . Chest pain 09/09/2015  . Elevated troponin 09/09/2015  . KNEE PAIN 12/02/2008   Qualifier: Diagnosis of  By: Romeo AppleHarrison MD, Duffy RhodyStanley    . NSTEMI (non-ST elevated myocardial infarction) (HCC) 09/09/2015  . Pain in the chest   . Palpitations 09/09/2015  . SVT (supraventricular tachycardia) (HCC)   . Tobacco abuse 09/09/2015    Past Surgical History:  Procedure Laterality Date  . KNEE SURGERY      Family History  Problem Relation Age of Onset  . Hypertension Mother   . Hypertension Father   . Asthma Brother   . Hypertension Maternal Grandmother   . Diabetes Maternal Grandmother     Social History Social History   Tobacco Use  . Smoking status: Current Every Day Smoker    Packs/day:  0.50    Types: Cigarettes  . Smokeless tobacco: Never Used  Substance Use Topics  . Alcohol use: No    Alcohol/week: 0.0 oz  . Drug use: Yes    Types: Marijuana    Comment: yesterday    No Known Allergies  Current Outpatient Medications  Medication Sig Dispense Refill  . HYDROcodone-acetaminophen (NORCO) 5-325 MG tablet Take 1 tablet by mouth every 8 (eight) hours as needed for moderate pain. 25 tablet 0  . metoprolol tartrate (LOPRESSOR) 50 MG tablet Take 1 tablet (50 mg total) by mouth 2 (two) times daily. 60 tablet 6  . naproxen (NAPROSYN) 500 MG tablet Take 1 tablet (500 mg total) by mouth 2 (two) times daily with a meal. 60 tablet 5  . penicillin v potassium (VEETID) 250 MG tablet Take 1 tablet (250 mg total) by mouth 4 (four) times daily. 20 tablet 0   No current facility-administered medications for this visit.      Physical Exam  Blood pressure 135/82, pulse 79, height 5\' 8"  (1.727 m), weight 147 lb (66.7 kg).  Constitutional: overall normal hygiene, normal nutrition, well developed, normal grooming, normal body habitus. Assistive device:none  Musculoskeletal: gait and station Limp none, muscle tone and strength are normal, no tremors or atrophy is present.  .  Neurological: coordination overall normal.  Deep tendon reflex/nerve stretch  intact.  Sensation normal.  Cranial nerves II-XII intact.   Skin:   Normal overall no scars, lesions, ulcers or rashes. No psoriasis.  Psychiatric: Alert and oriented x 3.  Recent memory intact, remote memory unclear.  Normal mood and affect. Well groomed.  Good eye contact.  Cardiovascular: overall no swelling, no varicosities, no edema bilaterally, normal temperatures of the legs and arms, no clubbing, cyanosis and good capillary refill.  Lymphatic: palpation is normal.  All other systems reviewed and are negative   Examination of left Upper Extremity is done.  Inspection:   Overall:  Elbow non-tender without crepitus or  defects, forearm non-tender without crepitus or defects, wrist non-tender without crepitus or defects, hand non-tender.    Shoulder: with glenohumeral joint tenderness, without effusion.   Upper arm: without swelling and tenderness   Range of motion:   Overall:  Full range of motion of the elbow, full range of motion of wrist and full range of motion in fingers.   Shoulder:  left  full degrees forward flexion; full degrees abduction; full degrees internal rotation, full degrees external rotation, full degrees extension, full degrees adduction.   Stability:   Overall:  Shoulder, elbow and wrist stable   Strength and Tone:   Overall full shoulder muscles strength, full upper arm strength and normal upper arm bulk and tone.  The patient has been educated about the nature of the problem(s) and counseled on treatment options.  The patient appeared to understand what I have discussed and is in agreement with it.  Encounter Diagnoses  Name Primary?  . Chronic left shoulder pain Yes  . Cigarette nicotine dependence without complication     PLAN Call if any problems.  Precautions discussed.  Continue current medications.   The patient was counseled about smoking and smoking cessation.  I spent about three to five minutes in doing this.  I, of course, determined the patient does smoke and frequency.  I have advised the patient of problems medically that can occur with continued smoking including poor wound and bone healing as well as the obvious respiratory problems.  I have assessed the patient's willingness to cut back and quit smoking.  The patient has stated he will definitely try to stop.  I have told the patient to discuss with their family doctor also about smoking cessation.  I am willing to assist my patient in arranging this and to get additional help.I have suggested the patient have a set date to try to begin cutting back.  I have printed out a smoking cessation form about how to  begin cutting back and suggestions.  I will discuss this further when I see the patient in the future.  I have stressed that although this will be very difficult to accomplish, the benefits are very substantial and will give long term health benefits from now on.  Return to clinic 3 months   I have reviewed the [facility] web site prior to prescribing narcotic medicine for this patient.  Electronically Signed Darreld McleanWayne Yeraldine Forney, MD 11/28/20189:32 AM

## 2017-06-29 ENCOUNTER — Telehealth: Payer: Self-pay | Admitting: Orthopaedic Surgery

## 2017-06-29 NOTE — Telephone Encounter (Signed)
Patient called for refill:  HYDROcodone-acetaminophen (NORCO) 5-325 MG tablet 25 tablet

## 2017-06-30 MED ORDER — HYDROCODONE-ACETAMINOPHEN 5-325 MG PO TABS
1.0000 | ORAL_TABLET | Freq: Three times a day (TID) | ORAL | 0 refills | Status: DC | PRN
Start: 2017-06-30 — End: 2017-07-06

## 2017-07-06 ENCOUNTER — Telehealth: Payer: Self-pay | Admitting: Orthopaedic Surgery

## 2017-07-06 MED ORDER — HYDROCODONE-ACETAMINOPHEN 5-325 MG PO TABS: 1.0000 | ORAL_TABLET | Freq: Three times a day (TID) | ORAL | 0 refills | Status: DC | PRN

## 2017-07-06 NOTE — Telephone Encounter (Signed)
Patient requests refill on Hydrocodone/Acetaminophen 5-325  Mgs.   Qty  25       Sig: Take 1 tablet by mouth every 8 (eight) hours as needed for moderate pain   Patient verified [facility] as his pharmacy

## 2017-07-14 ENCOUNTER — Telehealth: Payer: Self-pay | Admitting: Orthopaedic Surgery

## 2017-07-14 MED ORDER — HYDROCODONE-ACETAMINOPHEN 5-325 MG PO TABS: 1.0000 | ORAL_TABLET | Freq: Three times a day (TID) | ORAL | 0 refills | Status: DC | PRN

## 2017-07-14 NOTE — Telephone Encounter (Signed)
Patient requests refill on Hydrocodone/Acetaminophen 5-25  Mgs.   Qty  25  Sig: Take 1 tablet by mouth every 8 (eight) hours as needed for moderate pain.

## 2017-07-29 ENCOUNTER — Telehealth: Payer: Self-pay | Admitting: Orthopaedic Surgery

## 2017-07-29 NOTE — Telephone Encounter (Signed)
Hydrocodone-Acetaminophen 5/325 mg   Qty 20 Tablets 

## 2017-08-01 MED ORDER — HYDROCODONE-ACETAMINOPHEN 5-325 MG PO TABS: 1.0000 | ORAL_TABLET | Freq: Three times a day (TID) | ORAL | 0 refills | Status: DC | PRN

## 2017-08-10 ENCOUNTER — Telehealth: Payer: Self-pay | Admitting: Orthopaedic Surgery

## 2017-08-10 MED ORDER — HYDROCODONE-ACETAMINOPHEN 5-325 MG PO TABS: 1.0000 | ORAL_TABLET | Freq: Three times a day (TID) | ORAL | 0 refills | Status: DC | PRN

## 2017-08-10 NOTE — Telephone Encounter (Signed)
Patient requests refill of Hydrocodone/Acetaminophen 5-325 mgs.   Qty  20   Sig: Take 1 tablet by mouth every 8 (eight) hours as needed for moderate pain.  Pt states he uses [facility]

## 2017-08-18 ENCOUNTER — Telehealth: Payer: Self-pay | Admitting: Orthopaedic Surgery

## 2017-08-18 MED ORDER — HYDROCODONE-ACETAMINOPHEN 5-325 MG PO TABS: 1.0000 | ORAL_TABLET | Freq: Three times a day (TID) | ORAL | 0 refills | Status: DC | PRN

## 2017-08-18 NOTE — Telephone Encounter (Signed)
Hydrocodone-Acetaminophen  5/325 mg  Qty  20 Tablets   Patient uses [facility]

## 2017-08-24 ENCOUNTER — Telehealth: Payer: Self-pay | Admitting: Orthopaedic Surgery

## 2017-08-24 NOTE — Telephone Encounter (Signed)
Patient called for refill:  HYDROcodone-acetaminophen (NORCO) 5-325 MG tablet 20 tablet  - pharmacy is [facility].

## 2017-08-25 MED ORDER — HYDROCODONE-ACETAMINOPHEN 5-325 MG PO TABS: 1.0000 | ORAL_TABLET | Freq: Three times a day (TID) | ORAL | 0 refills | Status: DC | PRN

## 2017-09-01 ENCOUNTER — Telehealth: Payer: Self-pay | Admitting: Orthopaedic Surgery

## 2017-09-01 NOTE — Telephone Encounter (Signed)
Patient requests refill on Hydrocodone/Acetaminophen 5-325  Mgs.   Qty  15         Sig: Take 1 tablet by mouth every 8 (eight) hours as needed for moderate pain.     Patient states he uses [facility]

## 2017-09-05 NOTE — Telephone Encounter (Signed)
No more narcotics.  Take Advil, Tylenol or Aleve. 

## 2017-09-15 ENCOUNTER — Ambulatory Visit: Payer: Self-pay | Admitting: Orthopaedic Surgery

## 2017-09-15 ENCOUNTER — Encounter: Payer: Self-pay | Admitting: Orthopaedic Surgery

## 2017-09-15 VITALS — HR 69 | Temp 97.2°F | Ht 68.0 in | Wt 142.0 lb

## 2017-09-15 DIAGNOSIS — G8929 Other chronic pain: Secondary | ICD-10-CM

## 2017-09-15 DIAGNOSIS — M25512 Pain in left shoulder: Secondary | ICD-10-CM

## 2017-09-15 DIAGNOSIS — F17210 Nicotine dependence, cigarettes, uncomplicated: Secondary | ICD-10-CM

## 2017-09-15 MED ORDER — HYDROCODONE-ACETAMINOPHEN 5-325 MG PO TABS: 1.0000 | ORAL_TABLET | Freq: Three times a day (TID) | ORAL | 0 refills | Status: DC | PRN

## 2017-09-15 NOTE — Patient Instructions (Signed)

## 2017-09-15 NOTE — Progress Notes (Signed)
CC:  My shoulder still hurts at times  He has chronic left shoulder pain.  He has more pain with overhead use and cold weather.  He has no numbness or trauma.  He has full motion but pain in the extremes.  NV intact.  Grips normal.  Encounter Diagnoses  Name Primary?  . Chronic left shoulder pain Yes  . Cigarette nicotine dependence without complication    I have talked to him again about stopping smoking.  He has heart problems as well.  Return in three months.  I have reviewed the [place] Controlled Substance Reporting System web site prior to prescribing narcotic medicine for this patient.  Electronically Signed Darreld McleanWayne Breland Trouten, MD 2/28/20199:11 AM

## 2017-09-28 ENCOUNTER — Other Ambulatory Visit: Payer: Self-pay | Admitting: Orthopaedic Surgery

## 2017-09-28 MED ORDER — HYDROCODONE-ACETAMINOPHEN 5-325 MG PO TABS: 1.0000 | ORAL_TABLET | Freq: Three times a day (TID) | ORAL | 0 refills | Status: DC | PRN

## 2017-09-28 NOTE — Telephone Encounter (Signed)
Patient called for refill - aware that while Dr Hilda LiasKeeling is out of clinic for extended time that Dr Romeo AppleHarrison is reviewing requests: HYDROcodone-acetaminophen (NORCO) 5-325 MG tablet 25 tablet  - [facility]

## 2017-10-06 ENCOUNTER — Other Ambulatory Visit: Payer: Self-pay | Admitting: Orthopedic Surgery

## 2017-10-06 ENCOUNTER — Other Ambulatory Visit: Payer: Self-pay | Admitting: Orthopaedic Surgery

## 2017-10-06 MED ORDER — HYDROCODONE-ACETAMINOPHEN 5-325 MG PO TABS: 1.0000 | ORAL_TABLET | Freq: Three times a day (TID) | ORAL | 0 refills | Status: DC | PRN

## 2017-10-06 NOTE — Telephone Encounter (Signed)
Patient of Dr. Sanjuan DameKeeling's requests refill on Hydrocodone/Acetaminophen 5-325  Mgs.   Qty  25  Sig: Take 1 tablet by mouth every 8 (eight) hours as needed for moderate pain.        Patient uses [facility]

## 2017-10-17 ENCOUNTER — Other Ambulatory Visit: Payer: Self-pay | Admitting: Orthopaedic Surgery

## 2017-10-17 MED ORDER — HYDROCODONE-ACETAMINOPHEN 5-325 MG PO TABS: 1.0000 | ORAL_TABLET | Freq: Three times a day (TID) | ORAL | 0 refills | Status: DC | PRN

## 2017-10-17 NOTE — Telephone Encounter (Signed)
Hydrocodone-Acetaminophen 5/325 mg  Qty  25 Tablets  Take 1 tablet by mouth every 8 hours as needed for moderate pain.

## 2017-10-17 NOTE — Telephone Encounter (Signed)
Hydrocodone-Acetaminophen 5/325 mg  Qty  25 Tablets  Take 1 tablet by mouth every 8 hours as needed for moderate pain.  [facility]

## 2017-10-17 NOTE — Telephone Encounter (Signed)
Previous note should have that  PATIENT USES [facility]

## 2017-10-26 ENCOUNTER — Other Ambulatory Visit: Payer: Self-pay | Admitting: Orthopedic Surgery

## 2017-10-26 MED ORDER — HYDROCODONE-ACETAMINOPHEN 5-325 MG PO TABS: ORAL_TABLET | ORAL | 0 refills | Status: DC

## 2017-10-26 NOTE — Telephone Encounter (Signed)
Patient of Dr. Sanjuan DameKeeling's requests refill on Hydrocodone/Acetaminophen 5-325  Mgs.  Qty  25        Sig: Take 1 tablet by mouth every 8 (eight) hours as needed for moderate pain.    Patient states he uses [facility]

## 2017-11-02 ENCOUNTER — Other Ambulatory Visit: Payer: Self-pay | Admitting: Orthopaedic Surgery

## 2017-11-02 NOTE — Telephone Encounter (Signed)
Patient of Dr. Sanjuan DameKeeling's requests refill on Hydrocodone/Acetaminophen 5-325  Mgs.   Qty  25  Sig: TAKE 1 TABLET BY MOUTH EVERY 8 HOURS AS NEEDED FOR MODERATE PAIN.  Patient states he uses [facility]

## 2017-11-03 MED ORDER — HYDROCODONE-ACETAMINOPHEN 5-325 MG PO TABS: ORAL_TABLET | ORAL | 0 refills | Status: DC

## 2017-11-29 ENCOUNTER — Ambulatory Visit (INDEPENDENT_AMBULATORY_CARE_PROVIDER_SITE_OTHER): Payer: Self-pay | Admitting: Orthopaedic Surgery

## 2017-11-29 ENCOUNTER — Encounter: Payer: Self-pay | Admitting: Orthopaedic Surgery

## 2017-11-29 VITALS — BP 125/71 | HR 78 | Temp 98.0°F | Ht 68.0 in | Wt 143.0 lb

## 2017-11-29 DIAGNOSIS — F17210 Nicotine dependence, cigarettes, uncomplicated: Secondary | ICD-10-CM

## 2017-11-29 DIAGNOSIS — G8929 Other chronic pain: Secondary | ICD-10-CM

## 2017-11-29 DIAGNOSIS — M25512 Pain in left shoulder: Secondary | ICD-10-CM

## 2017-11-29 MED ORDER — HYDROCODONE-ACETAMINOPHEN 5-325 MG PO TABS: ORAL_TABLET | ORAL | 0 refills | Status: DC

## 2017-11-29 NOTE — Progress Notes (Signed)
Patient Alan Curtis A Lollar, male DOB:03/15/1986, 32 y.o. [id number]  Chief Complaint  Patient presents with  . Shoulder Pain    left    HPI  Alan Curtis is a 32 y.o. male with chronic left shoulder pain.  He has changed to a different job and is using his left arm more and more.  He has pain at the end of his shift.  He is taking his medicine.  He has no other new trauma. HPI  Body mass index is 21.74 kg/m.  ROS  Review of Systems  HENT: Negative for congestion.   Respiratory: Positive for shortness of breath. Negative for cough.   Cardiovascular: Positive for chest pain and palpitations. Negative for leg swelling.  Endocrine: Positive for cold intolerance.  Musculoskeletal: Positive for arthralgias.  Allergic/Immunologic: Positive for environmental allergies.  All other systems reviewed and are negative.   Past Medical History:  Diagnosis Date  . Chest pain 09/09/2015  . Elevated troponin 09/09/2015  . KNEE PAIN 12/02/2008   Qualifier: Diagnosis of  By: Romeo Apple MD, Duffy Rhody    . NSTEMI (non-ST elevated myocardial infarction) (HCC) 09/09/2015  . Pain in the chest   . Palpitations 09/09/2015  . SVT (supraventricular tachycardia) (HCC)   . Tobacco abuse 09/09/2015    Past Surgical History:  Procedure Laterality Date  . KNEE SURGERY      Family History  Problem Relation Age of Onset  . Hypertension Mother   . Hypertension Father   . Asthma Brother   . Hypertension Maternal Grandmother   . Diabetes Maternal Grandmother     Social History Social History   Tobacco Use  . Smoking status: Current Every Day Smoker    Packs/day: 0.50    Types: Cigarettes  . Smokeless tobacco: Never Used  Substance Use Topics  . Alcohol use: No    Alcohol/week: 0.0 oz  . Drug use: Yes    Types: Marijuana    Comment: yesterday    No Known Allergies  Current Outpatient Medications  Medication Sig Dispense Refill  . HYDROcodone-acetaminophen (NORCO/VICODIN) 5-325 MG tablet  TAKE 1 TABLET BY MOUTH EVERY 8 HOURS AS NEEDED FOR MODERATE PAIN. 25 tablet 0  . metoprolol tartrate (LOPRESSOR) 50 MG tablet Take 1 tablet (50 mg total) by mouth 2 (two) times daily. 60 tablet 6  . naproxen (NAPROSYN) 500 MG tablet Take 1 tablet (500 mg total) by mouth 2 (two) times daily with a meal. 60 tablet 5   No current facility-administered medications for this visit.      Physical Exam  Blood pressure 125/71, pulse 78, temperature 98 F (36.7 C), height  (1.727 m), weight 143 lb (64.9 kg).  Constitutional: overall normal hygiene, normal nutrition, well developed, normal grooming, normal body habitus. Assistive device:none  Musculoskeletal: gait and station Limp none, muscle tone and strength are normal, no tremors or atrophy is present.  .  Neurological: coordination overall normal.  Deep tendon reflex/nerve stretch intact.  Sensation normal.  Cranial nerves II-XII intact.   Skin:   Normal overall no scars, lesions, ulcers or rashes. No psoriasis.  Psychiatric: Alert and oriented x 3.  Recent memory intact, remote memory unclear.  Normal mood and affect. Well groomed.  Good eye contact.  Cardiovascular: overall no swelling, no varicosities, no edema bilaterally, normal temperatures of the legs and arms, no clubbing, cyanosis and good capillary refill.  Lymphatic: palpation is normal.  Examination of left Upper Extremity is done.  Inspection:   Overall:  Elbow  non-tender without crepitus or defects, forearm non-tender without crepitus or defects, wrist non-tender without crepitus or defects, hand non-tender.    Shoulder: with glenohumeral joint tenderness, without effusion.   Upper arm: without swelling and tenderness   Range of motion:   Overall:  Full range of motion of the elbow, full range of motion of wrist and full range of motion in fingers.   Shoulder:  left  180 degrees forward flexion; 180 degrees abduction; 45 degrees internal rotation, 45 degrees external  rotation, 35 degrees extension, 35 degrees adduction.   Stability:   Overall:  Shoulder, elbow and wrist stable   Strength and Tone:   Overall full shoulder muscles strength, full upper arm strength and normal upper arm bulk and tone.  He has crepitus of the left shoulder.  All other systems reviewed and are negative   The patient has been educated about the nature of the problem(s) and counseled on treatment options.  The patient appeared to understand what I have discussed and is in agreement with it.  Encounter Diagnoses  Name Primary?  . Chronic left shoulder pain   . Cigarette nicotine dependence without complication Yes    PLAN Call if any problems.  Precautions discussed.  Continue current medications.   Return to clinic 3 months   I have reviewed the [facility] Controlled Substance Reporting System web site prior to prescribing narcotic medicine for this patient. Electronically Signed Darreld Mclean, MD 5/14/20198:40 AM

## 2017-11-29 NOTE — Patient Instructions (Signed)
Steps to Quit Smoking Smoking tobacco can be bad for your health. It can also affect almost every organ in your body. Smoking puts you and people around you at risk for many serious long-lasting (chronic) diseases. Quitting smoking is hard, but it is one of the best things that you can do for your health. It is never too late to quit. What are the benefits of quitting smoking? When you quit smoking, you lower your risk for getting serious diseases and conditions. They can include:  Lung cancer or lung disease.  Heart disease.  Stroke.  Heart attack.  Not being able to have children (infertility).  Weak bones (osteoporosis) and broken bones (fractures).  If you have coughing, wheezing, and shortness of breath, those symptoms may get better when you quit. You may also get sick less often. If you are pregnant, quitting smoking can help to lower your chances of having a baby of low birth weight. What can I do to help me quit smoking? Talk with your doctor about what can help you quit smoking. Some things you can do (strategies) include:  Quitting smoking totally, instead of slowly cutting back how much you smoke over a period of time.  Going to in-person counseling. You are more likely to quit if you go to many counseling sessions.  Using resources and support systems, such as: ? Online chats with a counselor. ? Phone quitlines. ? Printed self-help materials. ? Support groups or group counseling. ? Text messaging programs. ? Mobile phone apps or applications.  Taking medicines. Some of these medicines may have nicotine in them. If you are pregnant or breastfeeding, do not take any medicines to quit smoking unless your doctor says it is okay. Talk with your doctor about counseling or other things that can help you.  Talk with your doctor about using more than one strategy at the same time, such as taking medicines while you are also going to in-person counseling. This can help make  quitting easier. What things can I do to make it easier to quit? Quitting smoking might feel very hard at first, but there is a lot that you can do to make it easier. Take these steps:  Talk to your family and friends. Ask them to support and encourage you.  Call phone quitlines, reach out to support groups, or work with a counselor.  Ask people who smoke to not smoke around you.  Avoid places that make you want (trigger) to smoke, such as: ? Bars. ? Parties. ? Smoke-break areas at work.  Spend time with people who do not smoke.  Lower the stress in your life. Stress can make you want to smoke. Try these things to help your stress: ? Getting regular exercise. ? Deep-breathing exercises. ? Yoga. ? Meditating. ? Doing a body scan. To do this, close your eyes, focus on one area of your body at a time from head to toe, and notice which parts of your body are tense. Try to relax the muscles in those areas.  Download or buy apps on your mobile phone or tablet that can help you stick to your quit plan. There are many free apps, such as QuitGuide from the CDC (Centers for Disease Control and Prevention). You can find more support from smokefree.gov and other websites.  This information is not intended to replace advice given to you by your health care provider. Make sure you discuss any questions you have with your health care provider. Document Released: 05/01/2009 Document   Revised: 03/02/2016 Document Reviewed: 11/19/2014 Elsevier Interactive Patient Education  2018 Elsevier Inc.  

## 2017-12-07 ENCOUNTER — Telehealth: Payer: Self-pay | Admitting: Orthopaedic Surgery

## 2017-12-07 NOTE — Telephone Encounter (Signed)
Too early.  Can do next week.

## 2017-12-07 NOTE — Telephone Encounter (Signed)
Called patient to notify

## 2017-12-07 NOTE — Telephone Encounter (Signed)
Patient called for refill:  HYDROcodone-acetaminophen (NORCO/VICODIN) 5-325 MG tablet 25 tablet 0  -[facility]

## 2017-12-13 ENCOUNTER — Telehealth: Payer: Self-pay | Admitting: Orthopaedic Surgery

## 2017-12-13 MED ORDER — HYDROCODONE-ACETAMINOPHEN 5-325 MG PO TABS: ORAL_TABLET | ORAL | 0 refills | Status: DC

## 2017-12-13 NOTE — Telephone Encounter (Signed)
Patient requests refill on Hydrocodone/Acetaminophen 5-325 mgs.   Qty  25       Sig: TAKE 1 TABLET BY MOUTH EVERY 8 HOURS AS NEEDED FOR MODERATE PAIN.     Patient states he uses [facility]

## 2017-12-15 ENCOUNTER — Ambulatory Visit: Payer: Self-pay | Admitting: Orthopaedic Surgery

## 2017-12-22 ENCOUNTER — Telehealth: Payer: Self-pay | Admitting: Orthopaedic Surgery

## 2017-12-22 NOTE — Telephone Encounter (Signed)
Patient called for refill: HYDROcodone-acetaminophen (NORCO/VICODIN) 5-325 MG tablet 20 tablet 0  - [facility]

## 2017-12-22 NOTE — Telephone Encounter (Signed)
Too early, another week is needed.

## 2017-12-26 ENCOUNTER — Telehealth: Payer: Self-pay | Admitting: Orthopaedic Surgery

## 2017-12-26 NOTE — Telephone Encounter (Signed)
Hydrocodone-Acetaminophen  5/325 mg  Qty 20 tablets  PATIENT USES [facility]

## 2017-12-27 NOTE — Telephone Encounter (Signed)
He needs to stop the narcotics.  Use Advil or Aleve

## 2017-12-28 ENCOUNTER — Encounter: Payer: Self-pay | Admitting: Orthopaedic Surgery

## 2017-12-28 ENCOUNTER — Ambulatory Visit (INDEPENDENT_AMBULATORY_CARE_PROVIDER_SITE_OTHER): Payer: BLUE CROSS/BLUE SHIELD

## 2017-12-28 ENCOUNTER — Ambulatory Visit (INDEPENDENT_AMBULATORY_CARE_PROVIDER_SITE_OTHER): Payer: BLUE CROSS/BLUE SHIELD | Admitting: Orthopaedic Surgery

## 2017-12-28 VITALS — BP 127/89 | HR 88 | Ht 68.0 in | Wt 137.0 lb

## 2017-12-28 DIAGNOSIS — G8929 Other chronic pain: Secondary | ICD-10-CM

## 2017-12-28 DIAGNOSIS — M25512 Pain in left shoulder: Secondary | ICD-10-CM

## 2017-12-28 MED ORDER — HYDROCODONE-ACETAMINOPHEN 5-325 MG PO TABS: ORAL_TABLET | ORAL | 0 refills | Status: DC

## 2017-12-28 NOTE — Patient Instructions (Addendum)
Steps to Quit Smoking Smoking tobacco can be bad for your health. It can also affect almost every organ in your body. Smoking puts you and people around you at risk for many serious Ilina Xu-lasting (chronic) diseases. Quitting smoking is hard, but it is one of the best things that you can do for your health. It is never too late to quit. What are the benefits of quitting smoking? When you quit smoking, you lower your risk for getting serious diseases and conditions. They can include:  Lung cancer or lung disease.  Heart disease.  Stroke.  Heart attack.  Not being able to have children (infertility).  Weak bones (osteoporosis) and broken bones (fractures).  If you have coughing, wheezing, and shortness of breath, those symptoms may get better when you quit. You may also get sick less often. If you are pregnant, quitting smoking can help to lower your chances of having a baby of low birth weight. What can I do to help me quit smoking? Talk with your doctor about what can help you quit smoking. Some things you can do (strategies) include:  Quitting smoking totally, instead of slowly cutting back how much you smoke over a period of time.  Going to in-person counseling. You are more likely to quit if you go to many counseling sessions.  Using resources and support systems, such as: ? Agricultural engineer with a Veterinary surgeon. ? Phone quitlines. ? Automotive engineer. ? Support groups or group counseling. ? Text messaging programs. ? Mobile phone apps or applications.  Taking medicines. Some of these medicines may have nicotine in them. If you are pregnant or breastfeeding, do not take any medicines to quit smoking unless your doctor says it is okay. Talk with your doctor about counseling or other things that can help you.  Talk with your doctor about using more than one strategy at the same time, such as taking medicines while you are also going to in-person counseling. This can help make  quitting easier. What things can I do to make it easier to quit? Quitting smoking might feel very hard at first, but there is a lot that you can do to make it easier. Take these steps:  Talk to your family and friends. Ask them to support and encourage you.  Call phone quitlines, reach out to support groups, or work with a Veterinary surgeon.  Ask people who smoke to not smoke around you.  Avoid places that make you want (trigger) to smoke, such as: ? Bars. ? Parties. ? Smoke-break areas at work.  Spend time with people who do not smoke.  Lower the stress in your life. Stress can make you want to smoke. Try these things to help your stress: ? Getting regular exercise. ? Deep-breathing exercises. ? Yoga. ? Meditating. ? Doing a body scan. To do this, close your eyes, focus on one area of your body at a time from head to toe, and notice which parts of your body are tense. Try to relax the muscles in those areas.  Download or buy apps on your mobile phone or tablet that can help you stick to your quit plan. There are many free apps, such as QuitGuide from the [employer] developer for Disease Control and Prevention). You can find more support from smokefree.gov and other websites.  This information is not intended to replace advice given to you by your health care provider. Make sure you discuss any questions you have with your health care provider.   OUT OF  WORK

## 2017-12-28 NOTE — Progress Notes (Signed)
Patient Alan Curtis, male DOB:1986/04/19, 32 y.o. [id number]  Chief Complaint  Patient presents with  . Follow-up    Chronic left shoulder pain    HPI  Alan Curtis is a 32 y.o. male who has increased pain of the left shoulder. He has a long history of pain in the shoulder.  He was told to go home from work yesterday because of shoulder pain and his inability to do the job with overhead lifting.  He has insurance now and wants to have more evaluation of the shoulder and possible MRI and possible pain clinic.  He has no trauma.  He has no weakness or effusion or numbness.  HPI  Body mass index is 20.83 kg/m.  ROS  Review of Systems  HENT: Negative for congestion.   Respiratory: Positive for shortness of breath. Negative for cough.   Cardiovascular: Positive for chest pain and palpitations. Negative for leg swelling.  Endocrine: Positive for cold intolerance.  Musculoskeletal: Positive for arthralgias.  Allergic/Immunologic: Positive for environmental allergies.  All other systems reviewed and are negative.   Past Medical History:  Diagnosis Date  . Chest pain 09/09/2015  . Elevated troponin 09/09/2015  . KNEE PAIN 12/02/2008   Qualifier: Diagnosis of  By: Romeo Apple MD, Duffy Rhody    . NSTEMI (non-ST elevated myocardial infarction) (HCC) 09/09/2015  . Pain in the chest   . Palpitations 09/09/2015  . SVT (supraventricular tachycardia) (HCC)   . Tobacco abuse 09/09/2015    Past Surgical History:  Procedure Laterality Date  . KNEE SURGERY      Family History  Problem Relation Age of Onset  . Hypertension Mother   . Hypertension Father   . Asthma Brother   . Hypertension Maternal Grandmother   . Diabetes Maternal Grandmother     Social History Social History   Tobacco Use  . Smoking status: Current Every Day Smoker    Packs/day: 0.50    Types: Cigarettes  . Smokeless tobacco: Never Used  Substance Use Topics  . Alcohol use: No    Alcohol/week: 0.0 oz   . Drug use: Yes    Types: Marijuana    Comment: yesterday    No Known Allergies  Current Outpatient Medications  Medication Sig Dispense Refill  . HYDROcodone-acetaminophen (NORCO/VICODIN) 5-325 MG tablet TAKE 1 TABLET BY MOUTH EVERY 8 HOURS AS NEEDED FOR MODERATE PAIN. 14 day limit. 56 tablet 0  . metoprolol tartrate (LOPRESSOR) 50 MG tablet Take 1 tablet (50 mg total) by mouth 2 (two) times daily. 60 tablet 6  . naproxen (NAPROSYN) 500 MG tablet Take 1 tablet (500 mg total) by mouth 2 (two) times daily with a meal. 60 tablet 5   No current facility-administered medications for this visit.      Physical Exam  Blood pressure 127/89, pulse 88, height 5\' 8"  (1.727 m), weight 137 lb (62.1 kg).  Constitutional: overall normal hygiene, normal nutrition, well developed, normal grooming, normal body habitus. Assistive device:none  Musculoskeletal: gait and station Limp none, muscle tone and strength are normal, no tremors or atrophy is present.  .  Neurological: coordination overall normal.  Deep tendon reflex/nerve stretch intact.  Sensation normal.  Cranial nerves II-XII intact.   Skin:   Normal overall no scars, lesions, ulcers or rashes. No psoriasis.  Psychiatric: Alert and oriented x 3.  Recent memory intact, remote memory unclear.  Normal mood and affect. Well groomed.  Good eye contact.  Cardiovascular: overall no swelling, no varicosities, no edema bilaterally, normal  temperatures of the legs and arms, no clubbing, cyanosis and good capillary refill.  Lymphatic: palpation is normal.  Examination of left Upper Extremity is done.  Inspection:   Overall:  Elbow non-tender without crepitus or defects, forearm non-tender without crepitus or defects, wrist non-tender without crepitus or defects, hand non-tender.    Shoulder: with glenohumeral joint tenderness, without effusion.   Upper arm: without swelling and tenderness   Range of motion:   Overall:  Full range of motion of  the elbow, full range of motion of wrist and full range of motion in fingers.   Shoulder:  left  165 degrees forward flexion; 150 degrees abduction; 35 degrees internal rotation, 35 degrees external rotation, 15 degrees extension, 40 degrees adduction.   Stability:   Overall:  Shoulder, elbow and wrist stable   Strength and Tone:   Overall full shoulder muscles strength, full upper arm strength and normal upper arm bulk and tone.  All other systems reviewed and are negative   X-rays were done of the left shoulder, reported separately.  The patient has been educated about the nature of the problem(s) and counseled on treatment options.  The patient appeared to understand what I have discussed and is in agreement with it.  Encounter Diagnosis  Name Primary?  . Chronic left shoulder pain Yes    PLAN Call if any problems.  Precautions discussed.  Continue current medications.   Return to clinic MRI of the left shoulder   I have reviewed the [facility] Cont[facility] Reporting [facility] web site prior to prescribing narcotic medicine for this patient.  Electronically Signed Darreld McleanWayne Franke Menter, MD 6/12/201910:08 AM

## 2018-01-02 ENCOUNTER — Ambulatory Visit (HOSPITAL_COMMUNITY)
Admission: RE | Admit: 2018-01-02 | Discharge: 2018-01-02 | Disposition: A | Discharge status: Home / Self Care | Payer: BLUE CROSS/BLUE SHIELD | Source: Ambulatory Visit | Attending: Orthopaedic Surgery | Admitting: Orthopaedic Surgery

## 2018-01-02 DIAGNOSIS — G8929 Other chronic pain: Secondary | ICD-10-CM | POA: Insufficient documentation

## 2018-01-02 DIAGNOSIS — M7552 Bursitis of left shoulder: Secondary | ICD-10-CM | POA: Insufficient documentation

## 2018-01-02 DIAGNOSIS — M25512 Pain in left shoulder: Secondary | ICD-10-CM | POA: Diagnosis not present

## 2018-01-02 DIAGNOSIS — M75102 Unspecified rotator cuff tear or rupture of left shoulder, not specified as traumatic: Secondary | ICD-10-CM | POA: Insufficient documentation

## 2018-01-04 ENCOUNTER — Ambulatory Visit (INDEPENDENT_AMBULATORY_CARE_PROVIDER_SITE_OTHER): Payer: BLUE CROSS/BLUE SHIELD | Admitting: Orthopaedic Surgery

## 2018-01-04 ENCOUNTER — Encounter: Payer: Self-pay | Admitting: Orthopaedic Surgery

## 2018-01-04 VITALS — BP 127/79 | HR 70 | Ht 68.0 in | Wt 143.0 lb

## 2018-01-04 DIAGNOSIS — G8929 Other chronic pain: Secondary | ICD-10-CM | POA: Diagnosis not present

## 2018-01-04 DIAGNOSIS — M25512 Pain in left shoulder: Secondary | ICD-10-CM

## 2018-01-04 NOTE — Progress Notes (Signed)
Patient LY:YTKPTW:Alan Curtis, male DOB:09/28/1985, 32 y.o. SFK:[id number]  Chief Complaint  Patient presents with  . Results    MRI Left Shoulder    HPI  Alan Curtis is a 32 y.o. male who has chronic left shoulder pain. He had a MRI which showed: IMPRESSION: 1. Mild rotator cuff tendinopathy/tendinosis. No partial or full-thickness rotator cuff tear. 2. Intact long head biceps tendon and glenoid labrum. 3. Mild lateral downsloping of a type 2 acromion but no other findings for bony impingement. 4. There is thickening of the capsular structures in the axillary recess which can be seen with adhesive capsulitis or synovitis. 5. Moderate subacromial/subdeltoid bursitis.  I have explained the findings to him.  He does not need surgery.  I have suggested exercises. HPI  Body mass index is 21.74 kg/m.  ROS  Review of Systems  HENT: Negative for congestion.   Respiratory: Positive for shortness of breath. Negative for cough.   Cardiovascular: Positive for chest pain and palpitations. Negative for leg swelling.  Endocrine: Positive for cold intolerance.  Musculoskeletal: Positive for arthralgias.  Allergic/Immunologic: Positive for environmental allergies.  All other systems reviewed and are negative.   Past Medical History:  Diagnosis Date  . Chest pain 09/09/2015  . Elevated troponin 09/09/2015  . KNEE PAIN 12/02/2008   Qualifier: Diagnosis of  By: Romeo AppleHarrison MD, Duffy RhodyStanley    . NSTEMI (non-ST elevated myocardial infarction) (HCC) 09/09/2015  . Pain in the chest   . Palpitations 09/09/2015  . SVT (supraventricular tachycardia) (HCC)   . Tobacco abuse 09/09/2015    Past Surgical History:  Procedure Laterality Date  . KNEE SURGERY      Family History  Problem Relation Age of Onset  . Hypertension Mother   . Hypertension Father   . Asthma Brother   . Hypertension Maternal Grandmother   . Diabetes Maternal Grandmother     Social History Social History   Tobacco Use  .  Smoking status: Current Every Day Smoker    Packs/day: 0.50    Types: Cigarettes  . Smokeless tobacco: Never Used  Substance Use Topics  . Alcohol use: No    Alcohol/week: 0.0 oz  . Drug use: Yes    Types: Marijuana    Comment: yesterday    No Known Allergies  Current Outpatient Medications  Medication Sig Dispense Refill  . HYDROcodone-acetaminophen (NORCO/VICODIN) 5-325 MG tablet TAKE 1 TABLET BY MOUTH EVERY 8 HOURS AS NEEDED FOR MODERATE PAIN. 14 day limit. 56 tablet 0  . metoprolol tartrate (LOPRESSOR) 50 MG tablet Take 1 tablet (50 mg total) by mouth 2 (two) times daily. 60 tablet 6  . naproxen (NAPROSYN) 500 MG tablet Take 1 tablet (500 mg total) by mouth 2 (two) times daily with a meal. 60 tablet 5   No current facility-administered medications for this visit.      Physical Exam  Blood pressure 127/79, pulse 70, height 5\' 8"  (1.727 m), weight 143 lb (64.9 kg).  Constitutional: overall normal hygiene, normal nutrition, well developed, normal grooming, normal body habitus. Assistive device:none  Musculoskeletal: gait and station Limp none, muscle tone and strength are normal, no tremors or atrophy is present.  .  Neurological: coordination overall normal.  Deep tendon reflex/nerve stretch intact.  Sensation normal.  Cranial nerves II-XII intact.   Skin:   Normal overall no scars, lesions, ulcers or rashes. No psoriasis.  Psychiatric: Alert and oriented x 3.  Recent memory intact, remote memory unclear.  Normal mood and affect. Well groomed.  Good eye contact.  Cardiovascular: overall no swelling, no varicosities, no edema bilaterally, normal temperatures of the legs and arms, no clubbing, cyanosis and good capillary refill.  Lymphatic: palpation is normal.  Left shoulder has full ROM with pain in the extremes.  NV intact.  ROM neck is full.  All other systems reviewed and are negative   The patient has been educated about the nature of the problem(s) and counseled  on treatment options.  The patient appeared to understand what I have discussed and is in agreement with it.  Encounter Diagnosis  Name Primary?  . Chronic left shoulder pain Yes    PLAN Call if any problems.  Precautions discussed.  Continue current medications.   Return to clinic 1 month   Electronically Signed Darreld Mclean, MD 6/19/20199:45 AM

## 2018-01-04 NOTE — Patient Instructions (Addendum)
Steps to Quit Smoking Smoking tobacco can be bad for your health. It can also affect almost every organ in your body. Smoking puts you and people around you at risk for many serious Alan Curtis-lasting (chronic) diseases. Quitting smoking is hard, but it is one of the best things that you can do for your health. It is never too late to quit. What are the benefits of quitting smoking? When you quit smoking, you lower your risk for getting serious diseases and conditions. They can include:  Lung cancer or lung disease.  Heart disease.  Stroke.  Heart attack.  Not being able to have children (infertility).  Weak bones (osteoporosis) and broken bones (fractures).  If you have coughing, wheezing, and shortness of breath, those symptoms may get better when you quit. You may also get sick less often. If you are pregnant, quitting smoking can help to lower your chances of having a baby of low birth weight. What can I do to help me quit smoking? Talk with your doctor about what can help you quit smoking. Some things you can do (strategies) include:  Quitting smoking totally, instead of slowly cutting back how much you smoke over a period of time.  Going to in-person counseling. You are more likely to quit if you go to many counseling sessions.  Using resources and support systems, such as: ? Online chats with a counselor. ? Phone quitlines. ? Printed self-help materials. ? Support groups or group counseling. ? Text messaging programs. ? Mobile phone apps or applications.  Taking medicines. Some of these medicines may have nicotine in them. If you are pregnant or breastfeeding, do not take any medicines to quit smoking unless your doctor says it is okay. Talk with your doctor about counseling or other things that can help you.  Talk with your doctor about using more than one strategy at the same time, such as taking medicines while you are also going to in-person counseling. This can help make  quitting easier. What things can I do to make it easier to quit? Quitting smoking might feel very hard at first, but there is a lot that you can do to make it easier. Take these steps:  Talk to your family and friends. Ask them to support and encourage you.  Call phone quitlines, reach out to support groups, or work with a counselor.  Ask people who smoke to not smoke around you.  Avoid places that make you want (trigger) to smoke, such as: ? Bars. ? Parties. ? Smoke-break areas at work.  Spend time with people who do not smoke.  Lower the stress in your life. Stress can make you want to smoke. Try these things to help your stress: ? Getting regular exercise. ? Deep-breathing exercises. ? Yoga. ? Meditating. ? Doing a body scan. To do this, close your eyes, focus on one area of your body at a time from head to toe, and notice which parts of your body are tense. Try to relax the muscles in those areas.  Download or buy apps on your mobile phone or tablet that can help you stick to your quit plan. There are many free apps, such as QuitGuide from the CDC (Centers for Disease Control and Prevention). You can find more support from smokefree.gov and other websites.  This information is not intended to replace advice given to you by your health care provider. Make sure you discuss any questions you have with your health care provider. Document Released: 05/01/2009 Document   Revised: 03/02/2016 Document Reviewed: 11/19/2014 Elsevier Interactive Patient Education  [employer]2018 Elsevier Inc.  Return to work tomorrow

## 2018-01-11 ENCOUNTER — Telehealth: Payer: Self-pay | Admitting: Orthopaedic Surgery

## 2018-01-11 MED ORDER — HYDROCODONE-ACETAMINOPHEN 5-325 MG PO TABS: ORAL_TABLET | ORAL | 0 refills | Status: DC

## 2018-01-11 NOTE — Telephone Encounter (Signed)
Patient called for refill: HYDROcodone-acetaminophen (NORCO/VICODIN) 5-325 MG tablet 56 tablet  - [facility] 

## 2018-01-30 ENCOUNTER — Encounter: Payer: Self-pay | Admitting: Orthopaedic Surgery

## 2018-01-30 NOTE — Telephone Encounter (Signed)
Put in as error

## 2018-01-31 ENCOUNTER — Encounter: Payer: Self-pay | Admitting: Orthopaedic Surgery

## 2018-01-31 ENCOUNTER — Ambulatory Visit: Payer: BLUE CROSS/BLUE SHIELD | Admitting: Orthopaedic Surgery

## 2018-01-31 VITALS — BP 128/78 | HR 70 | Ht 68.0 in | Wt 140.0 lb

## 2018-01-31 DIAGNOSIS — M25512 Pain in left shoulder: Secondary | ICD-10-CM

## 2018-01-31 DIAGNOSIS — G8929 Other chronic pain: Secondary | ICD-10-CM | POA: Diagnosis not present

## 2018-01-31 DIAGNOSIS — F17210 Nicotine dependence, cigarettes, uncomplicated: Secondary | ICD-10-CM

## 2018-01-31 MED ORDER — HYDROCODONE-ACETAMINOPHEN 5-325 MG PO TABS: ORAL_TABLET | ORAL | 0 refills | Status: DC

## 2018-01-31 NOTE — Progress Notes (Signed)
Patient YQ:MVHQIO Alan Curtis, male DOB:[birth year], 31 y.o. NGE:[id number]  Chief Complaint  Patient presents with  . Shoulder Pain    left    HPI  Alan Curtis is Alan 32 y.o. male who has chronic shoulder pain on the left.  He has no new trauma.  He has more pain after work and at night.  He has been doing his exercises and taking his medicine.  He is unable to go to the pain clinic in [place] secondary to transportation problems.  I will cancel it.  He is still smoking.  He has heart problems.  I have talked to him about this today.  The patient was counseled about smoking and smoking cessation.  I spent about three to five minutes in doing this.  I, of course, determined the patient does smoke and frequency.  I have advised the patient of problems medically that can occur with continued smoking including poor wound and bone healing as well as the obvious respiratory problems.  I have assessed the patient's willingness to cut back and quit smoking.  The patient has stated he wants to stop but it is hard to do.  I have told the patient to discuss with their family doctor also about smoking cessation.  I am willing to assist my patient in arranging this and to get additional help.I have suggested the patient have Alan set date to try to begin cutting back.  I have printed out Alan smoking cessation form about how to begin cutting back and suggestions.  I will discuss this further when I see the patient in the future.  I have stressed that although this will be very difficult to accomplish, the benefits are very substantial and will give long term health benefits from now on.   Body mass index is 21.29 kg/m.  ROS  Review of Systems  HENT: Negative for congestion.   Respiratory: Positive for shortness of breath. Negative for cough.   Cardiovascular: Positive for chest pain and palpitations. Negative for leg swelling.  Endocrine: Positive for cold intolerance.  Musculoskeletal: Positive  for arthralgias.  Allergic/Immunologic: Positive for environmental allergies.  All other systems reviewed and are negative.   All other systems reviewed and are negative.  Past Medical History:  Diagnosis Date  . Chest pain 09/09/2015  . Elevated troponin 09/09/2015  . KNEE PAIN 12/02/2008   Qualifier: Diagnosis of  By: Romeo Apple MD, Duffy Rhody    . NSTEMI (non-ST elevated myocardial infarction) (HCC) 09/09/2015  . Pain in the chest   . Palpitations 09/09/2015  . SVT (supraventricular tachycardia) (HCC)   . Tobacco abuse 09/09/2015    Past Surgical History:  Procedure Laterality Date  . KNEE SURGERY      Family History  Problem Relation Age of Onset  . Hypertension Mother   . Hypertension Father   . Asthma Brother   . Hypertension Maternal Grandmother   . Diabetes Maternal Grandmother     Social History Social History   Tobacco Use  . Smoking status: Current Every Day Smoker    Packs/day: 0.50    Types: Cigarettes  . Smokeless tobacco: Never Used  Substance Use Topics  . Alcohol use: No    Alcohol/week: 0.0 oz  . Drug use: Yes    Types: Marijuana    Comment: yesterday    No Known Allergies  Current Outpatient Medications  Medication Sig Dispense Refill  . HYDROcodone-acetaminophen (NORCO/VICODIN) 5-325 MG tablet TAKE 1 TABLET BY MOUTH EVERY 8 HOURS AS NEEDED  FOR MODERATE PAIN. 14 day limit. 56 tablet 0  . metoprolol tartrate (LOPRESSOR) 50 MG tablet Take 1 tablet (50 mg total) by mouth 2 (two) times daily. 60 tablet 6  . naproxen (NAPROSYN) 500 MG tablet Take 1 tablet (500 mg total) by mouth 2 (two) times daily with Alan meal. 60 tablet 5   No current facility-administered medications for this visit.      Physical Exam  Blood pressure 128/78, pulse 70, height 5\' 8"  (1.727 m), weight 140 lb (63.5 kg).  Constitutional: overall normal hygiene, normal nutrition, well developed, normal grooming, normal body habitus. Assistive device:none  Musculoskeletal: gait and  station Limp none, muscle tone and strength are normal, no tremors or atrophy is present.  .  Neurological: coordination overall normal.  Deep tendon reflex/nerve stretch intact.  Sensation normal.  Cranial nerves II-XII intact.   Skin:   Normal overall no scars, lesions, ulcers or rashes. No psoriasis.  Psychiatric: Alert and oriented x 3.  Recent memory intact, remote memory unclear.  Normal mood and affect. Well groomed.  Good eye contact.  Cardiovascular: overall no swelling, no varicosities, no edema bilaterally, normal temperatures of the legs and arms, no clubbing, cyanosis and good capillary refill.  Lymphatic: palpation is normal.  Left shoulder has full motion but pain in the extremes.  NV intact.  Grips normal. All other systems reviewed and are negative   The patient has been educated about the nature of the problem(s) and counseled on treatment options.  The patient appeared to understand what I have discussed and is in agreement with it.  Encounter Diagnoses  Name Primary?  . Chronic left shoulder pain Yes  . Cigarette nicotine dependence without complication     PLAN Call if any problems.  Precautions discussed.  Continue current medications.   Return to clinic 2 months   I have reviewed the [facility] web site prior to prescribing narcotic medicine for this patient. Electronically Signed Alan McleanWayne Anaston Koehn, MD 7/16/20199:28 AM

## 2018-02-01 ENCOUNTER — Ambulatory Visit: Payer: BLUE CROSS/BLUE SHIELD | Admitting: Orthopaedic Surgery

## 2018-02-13 ENCOUNTER — Telehealth: Payer: Self-pay | Admitting: Orthopaedic Surgery

## 2018-02-13 NOTE — Telephone Encounter (Signed)
Hydrocodone-Acetaminophen 5/325mg Qty 56 Tablets  PATIENT USES [facility] 

## 2018-02-14 MED ORDER — HYDROCODONE-ACETAMINOPHEN 5-325 MG PO TABS: ORAL_TABLET | ORAL | 0 refills | Status: DC

## 2018-03-01 ENCOUNTER — Ambulatory Visit: Payer: Self-pay | Admitting: Orthopaedic Surgery

## 2018-03-16 ENCOUNTER — Telehealth: Payer: Self-pay | Admitting: Orthopaedic Surgery

## 2018-03-16 MED ORDER — HYDROCODONE-ACETAMINOPHEN 5-325 MG PO TABS: ORAL_TABLET | ORAL | 0 refills | Status: DC

## 2018-03-16 NOTE — Telephone Encounter (Signed)
Patient requests refill: HYDROcodone-acetaminophen (NORCO/VICODIN) 5-325 MG tablet 50 tablet 0   -[facility]

## 2018-03-28 ENCOUNTER — Ambulatory Visit: Payer: BLUE CROSS/BLUE SHIELD | Admitting: Orthopaedic Surgery

## 2018-04-04 ENCOUNTER — Ambulatory Visit: Payer: BLUE CROSS/BLUE SHIELD | Admitting: Orthopaedic Surgery

## 2018-04-11 ENCOUNTER — Encounter: Payer: Self-pay | Admitting: Orthopaedic Surgery

## 2018-04-11 ENCOUNTER — Ambulatory Visit: Payer: BLUE CROSS/BLUE SHIELD | Admitting: Orthopaedic Surgery

## 2018-04-11 VITALS — BP 135/88 | HR 70 | Ht 68.0 in | Wt 141.0 lb

## 2018-04-11 DIAGNOSIS — G8929 Other chronic pain: Secondary | ICD-10-CM

## 2018-04-11 DIAGNOSIS — M25512 Pain in left shoulder: Secondary | ICD-10-CM

## 2018-04-11 MED ORDER — HYDROCODONE-ACETAMINOPHEN 5-325 MG PO TABS: ORAL_TABLET | ORAL | 0 refills | Status: DC

## 2018-04-11 NOTE — Patient Instructions (Signed)
Steps to Quit Smoking Smoking tobacco can be bad for your health. It can also affect almost every organ in your body. Smoking puts you and people around you at risk for many serious long-lasting (chronic) diseases. Quitting smoking is hard, but it is one of the best things that you can do for your health. It is never too late to quit. What are the benefits of quitting smoking? When you quit smoking, you lower your risk for getting serious diseases and conditions. They can include:  Lung cancer or lung disease.  Heart disease.  Stroke.  Heart attack.  Not being able to have children (infertility).  Weak bones (osteoporosis) and broken bones (fractures).  If you have coughing, wheezing, and shortness of breath, those symptoms may get better when you quit. You may also get sick less often. If you are pregnant, quitting smoking can help to lower your chances of having a baby of low birth weight. What can I do to help me quit smoking? Talk with your doctor about what can help you quit smoking. Some things you can do (strategies) include:  Quitting smoking totally, instead of slowly cutting back how much you smoke over a period of time.  Going to in-person counseling. You are more likely to quit if you go to many counseling sessions.  Using resources and support systems, such as: ? Online chats with a counselor. ? Phone quitlines. ? Printed self-help materials. ? Support groups or group counseling. ? Text messaging programs. ? Mobile phone apps or applications.  Taking medicines. Some of these medicines may have nicotine in them. If you are pregnant or breastfeeding, do not take any medicines to quit smoking unless your doctor says it is okay. Talk with your doctor about counseling or other things that can help you.  Talk with your doctor about using more than one strategy at the same time, such as taking medicines while you are also going to in-person counseling. This can help make  quitting easier. What things can I do to make it easier to quit? Quitting smoking might feel very hard at first, but there is a lot that you can do to make it easier. Take these steps:  Talk to your family and friends. Ask them to support and encourage you.  Call phone quitlines, reach out to support groups, or work with a counselor.  Ask people who smoke to not smoke around you.  Avoid places that make you want (trigger) to smoke, such as: ? Bars. ? Parties. ? Smoke-break areas at work.  Spend time with people who do not smoke.  Lower the stress in your life. Stress can make you want to smoke. Try these things to help your stress: ? Getting regular exercise. ? Deep-breathing exercises. ? Yoga. ? Meditating. ? Doing a body scan. To do this, close your eyes, focus on one area of your body at a time from head to toe, and notice which parts of your body are tense. Try to relax the muscles in those areas.  Download or buy apps on your mobile phone or tablet that can help you stick to your quit plan. There are many free apps, such as QuitGuide from the CDC (Centers for Disease Control and Prevention). You can find more support from smokefree.gov and other websites.  This information is not intended to replace advice given to you by your health care provider. Make sure you discuss any questions you have with your health care provider. Document Released: 05/01/2009 Document   Revised: 03/02/2016 Document Reviewed: 11/19/2014 Elsevier Interactive Patient Education  2018 Elsevier Inc.  

## 2018-04-11 NOTE — Progress Notes (Signed)
Patient Alan Curtis:Alan Curtis, male DOB:01/19/1986, 32 y.o. [id number]  Chief Complaint  Patient presents with  . Shoulder Pain    left    HPI  Mellody DanceWalter A Biebel is a 32 y.o. male who has continued pain of the left shoulder.  He has been working nightly and it bothers him at the end of his shift.  He has no new trauma, no numbness.  He has not been able to go to the pain clinic.  He will call them.    Body mass index is 21.44 kg/m.  ROS  Review of Systems  HENT: Negative for congestion.   Respiratory: Positive for shortness of breath. Negative for cough.   Cardiovascular: Positive for chest pain and palpitations. Negative for leg swelling.  Endocrine: Positive for cold intolerance.  Musculoskeletal: Positive for arthralgias.  Allergic/Immunologic: Positive for environmental allergies.  All other systems reviewed and are negative.   All other systems reviewed and are negative.  The following is a summary of the past history medically, past history surgically, known current medicines, social history and family history.  This information is gathered electronically by the computer from prior information and documentation.  I review this each visit and have found including this information at this point in the chart is beneficial and informative.    Past Medical History:  Diagnosis Date  . Chest pain 09/09/2015  . Elevated troponin 09/09/2015  . KNEE PAIN 12/02/2008   Qualifier: Diagnosis of  By: Romeo AppleHarrison MD, Duffy RhodyStanley    . NSTEMI (non-ST elevated myocardial infarction) (HCC) 09/09/2015  . Pain in the chest   . Palpitations 09/09/2015  . SVT (supraventricular tachycardia) (HCC)   . Tobacco abuse 09/09/2015    Past Surgical History:  Procedure Laterality Date  . KNEE SURGERY      Family History  Problem Relation Age of Onset  . Hypertension Mother   . Hypertension Father   . Asthma Brother   . Hypertension Maternal Grandmother   . Diabetes Maternal Grandmother     Social  History Social History   Tobacco Use  . Smoking status: Current Every Day Smoker    Packs/day: 0.50    Types: Cigarettes  . Smokeless tobacco: Never Used  Substance Use Topics  . Alcohol use: No    Alcohol/week: 0.0 standard drinks  . Drug use: Yes    Types: Marijuana    Comment: yesterday    No Known Allergies  Current Outpatient Medications  Medication Sig Dispense Refill  . HYDROcodone-acetaminophen (NORCO/VICODIN) 5-325 MG tablet TAKE 1 TABLET BY MOUTH EVERY 8 HOURS AS NEEDED FOR MODERATE PAIN. 14 day limit. 50 tablet 0  . metoprolol tartrate (LOPRESSOR) 50 MG tablet Take 1 tablet (50 mg total) by mouth 2 (two) times daily. 60 tablet 6  . naproxen (NAPROSYN) 500 MG tablet Take 1 tablet (500 mg total) by mouth 2 (two) times daily with a meal. 60 tablet 5   No current facility-administered medications for this visit.      Physical Exam  Blood pressure 135/88, pulse 70, height 5\' 8"  (1.727 m), weight 141 lb (64 kg).  Constitutional: overall normal hygiene, normal nutrition, well developed, normal grooming, normal body habitus. Assistive device:none  Musculoskeletal: gait and station Limp none, muscle tone and strength are normal, no tremors or atrophy is present.  .  Neurological: coordination overall normal.  Deep tendon reflex/nerve stretch intact.  Sensation normal.  Cranial nerves II-XII intact.   Skin:   Normal overall no scars, lesions, ulcers or  rashes. No psoriasis.  Psychiatric: Alert and oriented x 3.  Recent memory intact, remote memory unclear.  Normal mood and affect. Well groomed.  Good eye contact.  Cardiovascular: overall no swelling, no varicosities, no edema bilaterally, normal temperatures of the legs and arms, no clubbing, cyanosis and good capillary refill.  Lymphatic: palpation is normal.  Left shoulder has full motion but pain in the extremes.  NV intact.  All other systems reviewed and are negative   The patient has been educated about the  nature of the problem(s) and counseled on treatment options.  The patient appeared to understand what I have discussed and is in agreement with it.  Encounter Diagnosis  Name Primary?  . Chronic left shoulder pain Yes     PLAN Call if any problems.  Precautions discussed.  Continue current medications.   Return to clinic 3 months   I have reviewed the [facility] Controlled Substance Reporting System web site prior to prescribing narcotic medicine for this patient.   Electronically Signed Darreld Mclean, MD 9/24/20199:34 AM

## 2018-05-08 ENCOUNTER — Other Ambulatory Visit: Payer: Self-pay | Admitting: Orthopedic Surgery

## 2018-05-08 NOTE — Telephone Encounter (Signed)
DENIED MUST SEE PAIN CLINIC

## 2018-05-08 NOTE — Telephone Encounter (Signed)
[place],  [place]

## 2018-05-08 NOTE — Telephone Encounter (Signed)
Patient of Dr. Sanjuan Dame requests refill on Hydrocodone/Acetaminophen 5-325 mgs.  Qty  50  Sig: TAKE 1 TABLET BY MOUTH EVERY 8 HOURS AS NEEDED FOR MODERATE PAIN. 14 day limit.  Patient states he uses [facility]

## 2018-05-08 NOTE — Telephone Encounter (Signed)
Last office note from Dr Hilda Lias states He has not been able to go to the pain clinic.  He will call them.  Referral sent to pain clinic in June

## 2018-05-10 ENCOUNTER — Encounter (HOSPITAL_COMMUNITY): Payer: Self-pay | Admitting: Emergency Medicine

## 2018-05-10 ENCOUNTER — Emergency Department (HOSPITAL_COMMUNITY): Payer: BLUE CROSS/BLUE SHIELD

## 2018-05-10 ENCOUNTER — Other Ambulatory Visit: Payer: Self-pay

## 2018-05-10 ENCOUNTER — Emergency Department (HOSPITAL_COMMUNITY)
Admission: EM | Admit: 2018-05-10 | Discharge: 2018-05-10 | Disposition: A | Discharge status: Home / Self Care | Payer: BLUE CROSS/BLUE SHIELD | Attending: Emergency Medicine | Admitting: Emergency Medicine

## 2018-05-10 ENCOUNTER — Telehealth: Payer: Self-pay | Admitting: Orthopaedic Surgery

## 2018-05-10 DIAGNOSIS — R002 Palpitations: Secondary | ICD-10-CM | POA: Diagnosis present

## 2018-05-10 DIAGNOSIS — Z79899 Other long term (current) drug therapy: Secondary | ICD-10-CM | POA: Diagnosis not present

## 2018-05-10 DIAGNOSIS — F17210 Nicotine dependence, cigarettes, uncomplicated: Secondary | ICD-10-CM | POA: Insufficient documentation

## 2018-05-10 DIAGNOSIS — I252 Old myocardial infarction: Secondary | ICD-10-CM | POA: Insufficient documentation

## 2018-05-10 LAB — BASIC METABOLIC PANEL
Anion gap: 9 (ref 5–15)
BUN: 8 mg/dL (ref 6–20)
CHLORIDE: 104 mmol/L (ref 98–111)
CO2: 26 mmol/L (ref 22–32)
Calcium: 9.6 mg/dL (ref 8.9–10.3)
Creatinine, Ser: 1 mg/dL (ref 0.61–1.24)
GFR calc Af Amer: >60 mL/min (ref >60)
GFR calc non Af Amer: >60 mL/min (ref >60)
Glucose, Bld: 98 mg/dL (ref 70–99)
POTASSIUM: 3.8 mmol/L (ref 3.5–5.1)
Sodium: 139 mmol/L (ref 135–145)

## 2018-05-10 LAB — CBC
HEMATOCRIT: 45.4 % (ref 39.0–52.0)
HEMOGLOBIN: 14.4 g/dL (ref 13.0–17.0)
MCH: 28.7 pg (ref 26.0–34.0)
MCHC: 31.7 g/dL (ref 30.0–36.0)
MCV: 90.6 fL (ref 80.0–100.0)
Platelets: 265 10*3/uL (ref 150–400)
RBC: 5.01 MIL/uL (ref 4.22–5.81)
RDW: 14.6 % (ref 11.5–15.5)
WBC: 15.3 10*3/uL — AB (ref 4.0–10.5)
nRBC: 0 % (ref 0.0–0.2)

## 2018-05-10 LAB — I-STAT TROPONIN, ED: Troponin i, poc: 0 ng/mL (ref 0.00–0.08)

## 2018-05-10 NOTE — ED Triage Notes (Addendum)
Patient states he had an episode of increased heart rate, Dr's orders followed and episode subsided. Needs ok to return to work. Patient is awaiting cardiac ablation.

## 2018-05-10 NOTE — Telephone Encounter (Addendum)
Patient called to check about his pain medication and I relayed the message that he needed to be seen at the pain clinic. That is the reason Dr. Romeo Apple has denied his request, he was referred back in June and hasn't been as of today. Patient stated that he has an appointment scheduled for 12/9 and after I explained he would not be getting any pain medication, he stated he was going to call the pain clinic and see if he could get the appointment moved up.

## 2018-05-10 NOTE — Discharge Instructions (Signed)
Emergency department for an episode of fast heart rate that had subsided before he got here.  Your blood work EKG and chest x-ray did not show any obvious signs of damage.  It will be important for you to follow-up with your cardiology for continued management of this.  Please return if any concerns.

## 2018-05-10 NOTE — ED Provider Notes (Signed)
[facility] EMERGENCY DEPARTMENT Provider Note   CSN: [id number] Arrival date & time: 05/10/18  1708     History   Chief Complaint Chief Complaint  Patient presents with  . Chest Pain    brief episode of palpitations, has history of same, needs work clearance    HPI Alan Curtis is a 32 y.o. male.  He has a hx of  NSTEMI in the past and is also had some tachycardic episodes that have been diagnosed as SVT.  He tells me he is supposed to get a procedure done by cardiology but he still working on getting the money together to get that done.  Today while walking to work he felt his heart rate accelerate and he needed to do a vagal maneuver to stop it.  The tachycardia lasted about 5 minutes.  His supervisor noticed this and told him he needed to come to the hospital and get cleared before returning to work.  Patient himself denies any complaints now feels back to baseline.  Says this has happened before and this was a minor episode. The history is provided by the patient.  Palpitations   This is a recurrent problem. The current episode started 1 to 2 hours ago. The problem occurs rarely. The problem has been resolved. On average, each episode lasts 5 minutes. Pertinent negatives include no diaphoresis, no fever, no chest pain, no abdominal pain, no nausea, no vomiting, no headaches, no back pain and no shortness of breath. He has tried breathing exercises for the symptoms. The treatment provided significant relief. Risk factors include smoking/tobacco exposure. His past medical history is significant for heart disease.    Past Medical History:  Diagnosis Date  . Chest pain 09/09/2015  . Elevated troponin 09/09/2015  . KNEE PAIN 12/02/2008   Qualifier: Diagnosis of  By: Romeo Apple MD, Duffy Rhody    . NSTEMI (non-ST elevated myocardial infarction) (HCC) 09/09/2015  . Pain in the chest   . Palpitations 09/09/2015  . SVT (supraventricular tachycardia) (HCC)   . Tobacco abuse 09/09/2015     Patient Active Problem List   Diagnosis Date Noted  . Pain in the chest   . NSTEMI (non-ST elevated myocardial infarction) (HCC) 09/09/2015  . Chest pain 09/09/2015  . Elevated troponin 09/09/2015  . Palpitations 09/09/2015  . Tobacco abuse 09/09/2015  . KNEE PAIN 12/02/2008    Past Surgical History:  Procedure Laterality Date  . KNEE SURGERY          Home Medications    Prior to Admission medications   Medication Sig Start Date End Date Taking? Authorizing Provider  HYDROcodone-acetaminophen (NORCO/VICODIN) 5-325 MG tablet TAKE 1 TABLET BY MOUTH EVERY 8 HOURS AS NEEDED FOR MODERATE PAIN. 14 day limit. 04/11/18   Darreld Mclean, MD  metoprolol tartrate (LOPRESSOR) 50 MG tablet Take 1 tablet (50 mg total) by mouth 2 (two) times daily. 02/15/17   Darreld Mclean, MD  naproxen (NAPROSYN) 500 MG tablet Take 1 tablet (500 mg total) by mouth 2 (two) times daily with a meal. 11/23/16   Darreld Mclean, MD    Family History Family History  Problem Relation Age of Onset  . Hypertension Mother   . Hypertension Father   . Asthma Brother   . Hypertension Maternal Grandmother   . Diabetes Maternal Grandmother     Social History Social History   Tobacco Use  . Smoking status: Current Every Day Smoker    Packs/day: 0.50    Types: Cigarettes  . Smokeless tobacco:  Never Used  Substance Use Topics  . Alcohol use: No    Alcohol/week: 0.0 standard drinks  . Drug use: Yes    Types: Marijuana    Comment: Sunday     Allergies   Patient has no known allergies.   Review of Systems Review of Systems  Constitutional: Negative for diaphoresis and fever.  HENT: Negative for sore throat.   Eyes: Negative for visual disturbance.  Respiratory: Negative for shortness of breath.   Cardiovascular: Positive for palpitations. Negative for chest pain.  Gastrointestinal: Negative for abdominal pain, nausea and vomiting.  Genitourinary: Negative for dysuria.  Musculoskeletal: Negative for  back pain.  Skin: Negative for rash.  Neurological: Negative for syncope and headaches.     Physical Exam Updated Vital Signs BP (!) 139/97 (BP Location: Right Arm)   Pulse 96   Temp 98.4 F (36.9 C) (Oral)   Resp 17   Ht 5\' 8"  (1.727 m)   Wt 65.8 kg   SpO2 100%   BMI 22.05 kg/m   Physical Exam  Constitutional: He appears well-developed and well-nourished.  HENT:  Head: Normocephalic and atraumatic.  Eyes: Conjunctivae are normal.  Neck: Neck supple.  Cardiovascular: Normal rate and regular rhythm.  No murmur heard. Pulmonary/Chest: Effort normal and breath sounds normal. No respiratory distress.  Abdominal: Soft. There is no tenderness.  Musculoskeletal: He exhibits no edema, tenderness or deformity.  Neurological: He is alert.  Skin: Skin is warm and dry.  Psychiatric: He has a normal mood and affect.  Nursing note and vitals reviewed.    ED Treatments / Results  Labs (all labs ordered are listed, but only abnormal results are displayed) Labs Reviewed  CBC - Abnormal; Notable for the following components:      Result Value   WBC 15.3 (*)    All other components within normal limits  BASIC METABOLIC PANEL  I-STAT TROPONIN, ED    EKG EKG Interpretation  Date/Time:  Wednesday May 10 2018 17:35:00 EDT Ventricular Rate:  94 PR Interval:  108 QRS Duration: 88 QT Interval:  352 QTC Calculation: 440 R Axis:   78 Text Interpretation:  Sinus rhythm with sinus arrhythmia with short PR Right atrial enlargement Pulmonary disease pattern Nonspecific ST abnormality Abnormal QRS-T angle, consider primary T wave abnormality Abnormal ECG new twi inferior with st depression compared with 10/18 Confirmed by Meridee Score [phone]) on 05/10/2018 6:01:10 PM   Radiology Dg Chest 2 View  Result Date: 05/10/2018 CLINICAL DATA:  Tachycardia EXAM: CHEST - 2 VIEW COMPARISON:  05/13/2017 FINDINGS: The heart size and mediastinal contours are within normal limits. Both lungs  are clear. The visualized skeletal structures are unremarkable. Normal heart size and vascularity. Trachea is midline. Artifact overlies the left lower lung. IMPRESSION: No active cardiopulmonary disease. Electronically Signed   By: Judie Petit.  Shick M.D.   On: 05/10/2018 18:44    Procedures Procedures (including critical care time)  Medications Ordered in ED Medications - No data to display   Initial Impression / Assessment and Plan / ED Course  I have reviewed the triage vital signs and the nursing notes.  Pertinent labs & imaging results that were available during my care of the patient were reviewed by me and considered in my medical decision making (see chart for details).  Clinical Course as of May 11 1028  Wed May 10, 2018  1824 Prior Cardiac Testing/Procedures 1. Echocardiogram 09/10/2015 Left ventricle: The cavity size was normal. Wall thickness was normal. Systolic function was normal.  The estimated ejection fraction was in the range of 55% to 60%. Wall motion was normal; there were no regional wall motion abnormalities. Left ventricular diastolic function parameters were normal. - Mitral valve: There was trivial regurgitation. - Right atrium: Central venous pressure (est): 3 mm Hg. - Atrial septum: No defect or patent foramen ovale was identified. - Tricuspid valve: There was trivial regurgitation. - Pulmonary arteries: Systolic pressure could not be accurately estimated. - Pericardium, extracardiac: There was no pericardial effusion. Impressions: Normal LV wall thickness with LVEF 55-60% and normal diastolic function. Trivial mitral and tricuspid regurgitation.   [MB]    Clinical Course User Index [MB] Terrilee Files, MD     Final Clinical Impressions(s) / ED Diagnoses   Final diagnoses:  Palpitation    ED Discharge Orders    None       Terrilee Files, MD 05/11/18 1031

## 2018-06-11 ENCOUNTER — Other Ambulatory Visit: Payer: Self-pay

## 2018-06-11 ENCOUNTER — Emergency Department (HOSPITAL_COMMUNITY)
Admission: EM | Admit: 2018-06-11 | Discharge: 2018-06-11 | Disposition: A | Discharge status: Home / Self Care | Payer: BLUE CROSS/BLUE SHIELD | Attending: Emergency Medicine | Admitting: Emergency Medicine

## 2018-06-11 ENCOUNTER — Encounter (HOSPITAL_COMMUNITY): Payer: Self-pay | Admitting: Emergency Medicine

## 2018-06-11 DIAGNOSIS — Y929 Unspecified place or not applicable: Secondary | ICD-10-CM | POA: Insufficient documentation

## 2018-06-11 DIAGNOSIS — Y999 Unspecified external cause status: Secondary | ICD-10-CM | POA: Insufficient documentation

## 2018-06-11 DIAGNOSIS — S39012A Strain of muscle, fascia and tendon of lower back, initial encounter: Secondary | ICD-10-CM | POA: Diagnosis not present

## 2018-06-11 DIAGNOSIS — I252 Old myocardial infarction: Secondary | ICD-10-CM | POA: Insufficient documentation

## 2018-06-11 DIAGNOSIS — S3992XA Unspecified injury of lower back, initial encounter: Secondary | ICD-10-CM | POA: Diagnosis present

## 2018-06-11 DIAGNOSIS — Y9389 Activity, other specified: Secondary | ICD-10-CM | POA: Insufficient documentation

## 2018-06-11 DIAGNOSIS — Z79899 Other long term (current) drug therapy: Secondary | ICD-10-CM | POA: Insufficient documentation

## 2018-06-11 DIAGNOSIS — X500XXA Overexertion from strenuous movement or load, initial encounter: Secondary | ICD-10-CM | POA: Insufficient documentation

## 2018-06-11 DIAGNOSIS — F17210 Nicotine dependence, cigarettes, uncomplicated: Secondary | ICD-10-CM | POA: Insufficient documentation

## 2018-06-11 MED ORDER — IBUPROFEN 800 MG PO TABS: 800.0000 mg | ORAL_TABLET | Freq: Three times a day (TID) | ORAL | 0 refills | Status: DC

## 2018-06-11 MED ORDER — CYCLOBENZAPRINE HCL 10 MG PO TABS: 10.0000 mg | ORAL_TABLET | Freq: Three times a day (TID) | ORAL | 0 refills | Status: DC | PRN

## 2018-06-11 NOTE — ED Provider Notes (Signed)
[facility] EMERGENCY DEPARTMENT Provider Note   CSN: [id number] Arrival date & time: 06/11/18  1056     History   Chief Complaint Chief Complaint  Patient presents with  . Back Pain    HPI RAWLINS STUARD is a 32 y.o. male.  HPI   EGAN BERKHEIMER is a 32 y.o. male who presents to the Emergency Department complaining of right-sided low back pain after lifting a large bag of dog food.  He describes aching pain localized to his right lower back and associated with movements.  Pain improves at rest.  He took 1 dose of ibuprofen without relief.  He denies fall, fever, chills, abdominal pain, numbness pain or weakness of the lower extremities and urine or bowel changes.   Past Medical History:  Diagnosis Date  . Chest pain 09/09/2015  . Elevated troponin 09/09/2015  . KNEE PAIN 12/02/2008   Qualifier: Diagnosis of  By: Romeo Apple MD, Duffy Rhody    . NSTEMI (non-ST elevated myocardial infarction) (HCC) 09/09/2015  . Pain in the chest   . Palpitations 09/09/2015  . SVT (supraventricular tachycardia) (HCC)   . Tobacco abuse 09/09/2015    Patient Active Problem List   Diagnosis Date Noted  . Pain in the chest   . NSTEMI (non-ST elevated myocardial infarction) (HCC) 09/09/2015  . Chest pain 09/09/2015  . Elevated troponin 09/09/2015  . Palpitations 09/09/2015  . Tobacco abuse 09/09/2015  . KNEE PAIN 12/02/2008    Past Surgical History:  Procedure Laterality Date  . KNEE SURGERY          Home Medications    Prior to Admission medications   Medication Sig Start Date End Date Taking? Authorizing Provider  HYDROcodone-acetaminophen (NORCO/VICODIN) 5-325 MG tablet TAKE 1 TABLET BY MOUTH EVERY 8 HOURS AS NEEDED FOR MODERATE PAIN. 14 day limit. 04/11/18   Darreld Mclean, MD  metoprolol tartrate (LOPRESSOR) 50 MG tablet Take 1 tablet (50 mg total) by mouth 2 (two) times daily. 02/15/17   Darreld Mclean, MD  naproxen (NAPROSYN) 500 MG tablet Take 1 tablet (500 mg total) by mouth 2  (two) times daily with a meal. 11/23/16   Darreld Mclean, MD    Family History Family History  Problem Relation Age of Onset  . Hypertension Mother   . Hypertension Father   . Asthma Brother   . Hypertension Maternal Grandmother   . Diabetes Maternal Grandmother     Social History Social History   Tobacco Use  . Smoking status: Current Every Day Smoker    Packs/day: 0.50    Types: Cigarettes  . Smokeless tobacco: Never Used  Substance Use Topics  . Alcohol use: No    Alcohol/week: 0.0 standard drinks  . Drug use: Yes    Types: Marijuana    Comment: Sunday     Allergies   Patient has no known allergies.   Review of Systems Review of Systems  Constitutional: Negative for fever.  Respiratory: Negative for shortness of breath.   Gastrointestinal: Negative for abdominal pain, constipation and vomiting.  Genitourinary: Negative for decreased urine volume, difficulty urinating, dysuria, flank pain and hematuria.  Musculoskeletal: Positive for back pain. Negative for joint swelling.  Skin: Negative for rash.  Neurological: Negative for weakness and numbness.     Physical Exam Updated Vital Signs BP (!) 129/102 (BP Location: Right Arm)   Pulse 76   Temp 97.6 F (36.4 C) (Oral)   Resp 17   Ht 5\' 8"  (1.727 m)   Wt 63.5  kg   SpO2 100%   BMI 21.29 kg/m   Physical Exam  Constitutional: He is oriented to person, place, and time. He appears well-developed and well-nourished. No distress.  HENT:  Head: Normocephalic and atraumatic.  Neck: Normal range of motion. Neck supple.  Cardiovascular: Normal rate, regular rhythm and intact distal pulses.  DP pulses are strong and palpable bilaterally  Pulmonary/Chest: Effort normal and breath sounds normal. No respiratory distress.  Abdominal: Soft. He exhibits no distension. There is no tenderness.  Musculoskeletal: He exhibits tenderness. He exhibits no edema.       Lumbar back: He exhibits tenderness and pain. He exhibits  normal range of motion, no swelling, no deformity, no laceration and normal pulse.  ttp of the right lower lumbar paraspinal muscles.  No spinal tenderness.  Pt has 5/5 strength against resistance of bilateral lower extremities.  Neg SLR bilaterally   Neurological: He is alert and oriented to person, place, and time. He has normal strength. No sensory deficit. He exhibits normal muscle tone. Coordination and gait normal.  Reflex Scores:      Patellar reflexes are 2+ on the right side and 2+ on the left side.      Achilles reflexes are 2+ on the right side and 2+ on the left side. Skin: Skin is warm and dry. Capillary refill takes less than 2 seconds. No rash noted.  Nursing note and vitals reviewed.    ED Treatments / Results  Labs (all labs ordered are listed, but only abnormal results are displayed) Labs Reviewed - No data to display  EKG None  Radiology No results found.  Procedures Procedures (including critical care time)  Medications Ordered in ED Medications - No data to display   Initial Impression / Assessment and Plan / ED Course  I have reviewed the triage vital signs and the nursing notes.  Pertinent labs & imaging results that were available during my care of the patient were reviewed by me and considered in my medical decision making (see chart for details).     Patient well-appearing.  Focal tenderness of the right lower back after a lifting injury.  Likely musculoskeletal.  No concerning symptoms for urinary infection or neurological process.  Neurovascularly intact.  Ambulates with a steady gait.  Patient agrees to symptomatic treatment with NSAID, muscle relaxer, and outpatient follow-up if not improving.  Return precautions discussed.  Final Clinical Impressions(s) / ED Diagnoses   Final diagnoses:  Strain of lumbar region, initial encounter    ED Discharge Orders    None       Pauline Ausriplett, Kalyn Dimattia, PA-C 06/11/18 1149    Terrilee FilesButler, Michael C,  MD 06/11/18 1721

## 2018-06-11 NOTE — ED Triage Notes (Signed)
Patient c/o right mi- low back pain that started Friday. Patient states that he lifted something up Thursday but had no pain then. Denies any urinary symptoms or fevers. Denies taking anything for pain.

## 2018-06-11 NOTE — ED Notes (Signed)
Given water, not able to void at this time.

## 2018-06-11 NOTE — Discharge Instructions (Signed)
Alternate ice and heat to your lower back.  Follow-up with your primary doctor in 1 week if your symptoms are not improving.

## 2018-07-06 ENCOUNTER — Ambulatory Visit (INDEPENDENT_AMBULATORY_CARE_PROVIDER_SITE_OTHER): Payer: BLUE CROSS/BLUE SHIELD | Admitting: Orthopaedic Surgery

## 2018-07-06 ENCOUNTER — Encounter: Payer: Self-pay | Admitting: Orthopaedic Surgery

## 2018-07-06 VITALS — BP 146/94 | HR 84 | Ht 68.0 in | Wt 138.0 lb

## 2018-07-06 DIAGNOSIS — F17210 Nicotine dependence, cigarettes, uncomplicated: Secondary | ICD-10-CM

## 2018-07-06 DIAGNOSIS — M25512 Pain in left shoulder: Secondary | ICD-10-CM | POA: Diagnosis not present

## 2018-07-06 DIAGNOSIS — G8929 Other chronic pain: Secondary | ICD-10-CM

## 2018-07-06 MED ORDER — HYDROCODONE-ACETAMINOPHEN 5-325 MG PO TABS: ORAL_TABLET | ORAL | 0 refills | Status: DC

## 2018-07-06 NOTE — Progress Notes (Signed)
Patient ON:GEXBMW:Alan Curtis, male DOB:07/31/1985, 32 y.o. UXL:[id number]  Chief Complaint  Patient presents with  . Shoulder Pain    HPI  Alan Curtis is a 32 y.o. male who has chronic left shoulder pain.  He has no new trauma to the shoulder.  He hurt his back at work on 06-06-18.  He has been let go of his job.  He went to the ER for his back after the injury.     Body mass index is 20.98 kg/m.  ROS  Review of Systems  HENT: Negative for congestion.   Respiratory: Positive for shortness of breath. Negative for cough.   Cardiovascular: Positive for chest pain and palpitations. Negative for leg swelling.  Endocrine: Positive for cold intolerance.  Musculoskeletal: Positive for arthralgias.  Allergic/Immunologic: Positive for environmental allergies.  All other systems reviewed and are negative.   All other systems reviewed and are negative.  The following is a summary of the past history medically, past history surgically, known current medicines, social history and family history.  This information is gathered electronically by the computer from prior information and documentation.  I review this each visit and have found including this information at this point in the chart is beneficial and informative.    Past Medical History:  Diagnosis Date  . Chest pain 09/09/2015  . Elevated troponin 09/09/2015  . KNEE PAIN 12/02/2008   Qualifier: Diagnosis of  By: Romeo AppleHarrison MD, Duffy RhodyStanley    . NSTEMI (non-ST elevated myocardial infarction) (HCC) 09/09/2015  . Pain in the chest   . Palpitations 09/09/2015  . SVT (supraventricular tachycardia) (HCC)   . Tobacco abuse 09/09/2015    Past Surgical History:  Procedure Laterality Date  . KNEE SURGERY      Family History  Problem Relation Age of Onset  . Hypertension Mother   . Hypertension Father   . Asthma Brother   . Hypertension Maternal Grandmother   . Diabetes Maternal Grandmother     Social History Social History    Tobacco Use  . Smoking status: Current Every Day Smoker    Packs/day: 0.50    Types: Cigarettes  . Smokeless tobacco: Never Used  Substance Use Topics  . Alcohol use: No    Alcohol/week: 0.0 standard drinks  . Drug use: Yes    Types: Marijuana    Comment: Sunday    No Known Allergies  Current Outpatient Medications  Medication Sig Dispense Refill  . cyclobenzaprine (FLEXERIL) 10 MG tablet Take 1 tablet (10 mg total) by mouth 3 (three) times daily as needed. 21 tablet 0  . HYDROcodone-acetaminophen (NORCO/VICODIN) 5-325 MG tablet One tablet every four hours for pain. 30 tablet 0  . ibuprofen (ADVIL,MOTRIN) 800 MG tablet Take 1 tablet (800 mg total) by mouth 3 (three) times daily. 21 tablet 0  . metoprolol tartrate (LOPRESSOR) 50 MG tablet Take 1 tablet (50 mg total) by mouth 2 (two) times daily. 60 tablet 6   No current facility-administered medications for this visit.      Physical Exam  Blood pressure (!) 146/94, pulse 84, height 5\' 8"  (1.727 m), weight 138 lb (62.6 kg).  Constitutional: overall normal hygiene, normal nutrition, well developed, normal grooming, normal body habitus. Assistive device:none  Musculoskeletal: gait and station Limp none, muscle tone and strength are normal, no tremors or atrophy is present.  .  Neurological: coordination overall normal.  Deep tendon reflex/nerve stretch intact.  Sensation normal.  Cranial nerves II-XII intact.   Skin:   Normal  overall no scars, lesions, ulcers or rashes. No psoriasis.  Psychiatric: Alert and oriented x 3.  Recent memory intact, remote memory unclear.  Normal mood and affect. Well groomed.  Good eye contact.  Cardiovascular: overall no swelling, no varicosities, no edema bilaterally, normal temperatures of the legs and arms, no clubbing, cyanosis and good capillary refill.  Lymphatic: palpation is normal.  Left shoulder has full ROM but tender in the extremes.   All other systems reviewed and are  negative   The patient has been educated about the nature of the problem(s) and counseled on treatment options.  The patient appeared to understand what I have discussed and is in agreement with it.  Encounter Diagnoses  Name Primary?  . Chronic left shoulder pain Yes  . Cigarette nicotine dependence without complication     PLAN Call if any problems.  Precautions discussed.  Continue current medications.   Return to clinic 6 weeks   I have reviewed the [facility] [place] [place] web site prior to prescribing narcotic medicine for this patient.      Electronically Signed Darreld McleanWayne Sharion Grieves, MD 12/19/201910:56 AM

## 2018-07-18 ENCOUNTER — Telehealth: Payer: Self-pay | Admitting: Orthopaedic Surgery

## 2018-07-18 MED ORDER — HYDROCODONE-ACETAMINOPHEN 5-325 MG PO TABS: ORAL_TABLET | ORAL | 0 refills | Status: DC

## 2018-07-18 NOTE — Telephone Encounter (Signed)
Hydrocodone-Acetaminophen  5/325 mg  Qty 30 Tablets ° °PATIENT USES [facility] °

## 2018-07-31 ENCOUNTER — Telehealth: Payer: Self-pay | Admitting: Orthopaedic Surgery

## 2018-07-31 NOTE — Telephone Encounter (Signed)
Patient requests refill on Hydrocodone/Acetaminophen 5-325 mgs.  Qty  30       Sig: One tablet every four hours for pain.     Patient uses [facility]

## 2018-08-01 MED ORDER — HYDROCODONE-ACETAMINOPHEN 5-325 MG PO TABS: ORAL_TABLET | ORAL | 0 refills | Status: DC

## 2018-08-10 ENCOUNTER — Telehealth: Payer: Self-pay | Admitting: Orthopaedic Surgery

## 2018-08-10 ENCOUNTER — Other Ambulatory Visit: Payer: Self-pay | Admitting: Orthopaedic Surgery

## 2018-08-10 NOTE — Telephone Encounter (Signed)
Patient requests refill on Hydrocodone/Acetaminophen 5-325 mgs.  Qty  25  Sig: One tablet every four hours for pain  He states he uses [facility]

## 2018-08-17 ENCOUNTER — Telehealth: Payer: Self-pay | Admitting: Orthopaedic Surgery

## 2018-08-17 NOTE — Telephone Encounter (Signed)
Hydrocodone-Acetaminophen 5/325mg  Qty 25 Tablets  PATIENT USES [facility] 

## 2018-08-21 MED ORDER — HYDROCODONE-ACETAMINOPHEN 5-325 MG PO TABS: ORAL_TABLET | ORAL | 0 refills | Status: DC

## 2018-08-30 ENCOUNTER — Telehealth: Payer: Self-pay | Admitting: Orthopaedic Surgery

## 2018-08-30 MED ORDER — HYDROCODONE-ACETAMINOPHEN 5-325 MG PO TABS: ORAL_TABLET | ORAL | 0 refills | Status: DC

## 2018-08-30 NOTE — Telephone Encounter (Signed)
Patient called to request refill: HYDROcodone-acetaminophen (NORCO/VICODIN) 5-325 MG tablet 20 tablet 0   - [facility]

## 2018-09-07 ENCOUNTER — Telehealth: Payer: Self-pay | Admitting: Orthopaedic Surgery

## 2018-09-07 NOTE — Telephone Encounter (Signed)
Hydrocodone-Acetaminophen 5/325 mg  Qty 20 Tablets  PATIENT USES [facility] 

## 2018-09-12 MED ORDER — HYDROCODONE-ACETAMINOPHEN 5-325 MG PO TABS: ORAL_TABLET | ORAL | 0 refills | Status: DC

## 2018-09-21 ENCOUNTER — Telehealth: Payer: Self-pay | Admitting: Orthopaedic Surgery

## 2018-09-21 NOTE — Telephone Encounter (Signed)
Hydrocodone-Acetaminophen 5/325 mg  Qty 20 Tablets  PATIENT USES [facility] 

## 2018-09-25 MED ORDER — HYDROCODONE-ACETAMINOPHEN 5-325 MG PO TABS: ORAL_TABLET | ORAL | 0 refills | Status: DC

## 2018-10-04 ENCOUNTER — Other Ambulatory Visit: Payer: Self-pay

## 2018-10-04 ENCOUNTER — Ambulatory Visit: Payer: Self-pay | Admitting: Orthopaedic Surgery

## 2018-10-04 ENCOUNTER — Encounter: Payer: Self-pay | Admitting: Orthopaedic Surgery

## 2018-10-04 VITALS — BP 141/86 | HR 96 | Ht 68.0 in | Wt 139.0 lb

## 2018-10-04 DIAGNOSIS — F17210 Nicotine dependence, cigarettes, uncomplicated: Secondary | ICD-10-CM

## 2018-10-04 DIAGNOSIS — M25512 Pain in left shoulder: Secondary | ICD-10-CM

## 2018-10-04 DIAGNOSIS — G8929 Other chronic pain: Secondary | ICD-10-CM

## 2018-10-04 MED ORDER — HYDROCODONE-ACETAMINOPHEN 5-325 MG PO TABS: ORAL_TABLET | ORAL | 0 refills | Status: DC

## 2018-10-04 NOTE — Progress Notes (Signed)
CC:  My shoulder still hurts.  He has pain in the left shoulder, chronically.  He has no new trauma, no effusion.  He is taking his medicine.  He starts a new job today.  He has pain with overhead use.  ROM is full but tender in the extremes.  Encounter Diagnoses  Name Primary?  . Chronic left shoulder pain Yes  . Cigarette nicotine dependence without complication    I have reviewed the [place] Controlled Substance Reporting System web site prior to prescribing narcotic medicine for this patient.   Continue exercises and medicine.  Return in three months.  Electronically Signed Darreld Mclean, MD 3/18/20208:48 AM

## 2018-10-05 ENCOUNTER — Ambulatory Visit: Payer: BLUE CROSS/BLUE SHIELD | Admitting: Orthopaedic Surgery

## 2018-10-12 ENCOUNTER — Telehealth: Payer: Self-pay | Admitting: Orthopaedic Surgery

## 2018-10-12 MED ORDER — HYDROCODONE-ACETAMINOPHEN 5-325 MG PO TABS: ORAL_TABLET | ORAL | 0 refills | Status: DC

## 2018-10-12 NOTE — Telephone Encounter (Signed)
Patient requests refill on Hydrocodone/Acetaminophen 5-325  Mgs.  Qty 20  Sig: TAKE 1 TABLET BY MOUTH EVERY 4 HOURS FOR PAIN.

## 2018-10-23 ENCOUNTER — Telehealth: Payer: Self-pay | Admitting: Orthopaedic Surgery

## 2018-10-23 MED ORDER — HYDROCODONE-ACETAMINOPHEN 5-325 MG PO TABS: ORAL_TABLET | ORAL | 0 refills | Status: DC

## 2018-10-23 NOTE — Telephone Encounter (Signed)
Patient called for refill:  HYDROcodone-acetaminophen (NORCO/VICODIN) 5-325 MG tablet 20 tablet   Pharmacy:  [facility]

## 2018-11-01 ENCOUNTER — Telehealth: Payer: Self-pay | Admitting: Orthopaedic Surgery

## 2018-11-01 NOTE — Telephone Encounter (Signed)
Patient requests refill on Hydrocodone/Acetaminophen 5-325  Mgs. Qty  20  Sig: TAKE 1 TABLET BY MOUTH EVERY 4 HOURS FOR PAIN. Must last 14 days.  Patient states he uses [facility] 

## 2018-11-02 MED ORDER — HYDROCODONE-ACETAMINOPHEN 5-325 MG PO TABS: ORAL_TABLET | ORAL | 0 refills | Status: DC

## 2018-11-16 ENCOUNTER — Telehealth: Payer: Self-pay | Admitting: Orthopaedic Surgery

## 2018-11-16 MED ORDER — HYDROCODONE-ACETAMINOPHEN 5-325 MG PO TABS: ORAL_TABLET | ORAL | 0 refills | Status: DC

## 2018-11-16 NOTE — Telephone Encounter (Signed)
Patient requests refill on Hydrocodone/Acetaminophen  5-325 Qty 20  Sig: TAKE 1 TABLET BY MOUTH EVERY 4 HOURS FOR PAIN. Must last 14 days.   Patient states he uses [facility]

## 2018-11-30 ENCOUNTER — Telehealth: Payer: Self-pay | Admitting: Orthopaedic Surgery

## 2018-11-30 MED ORDER — HYDROCODONE-ACETAMINOPHEN 5-325 MG PO TABS: ORAL_TABLET | ORAL | 0 refills | Status: DC

## 2018-11-30 NOTE — Telephone Encounter (Signed)
Hydrocodone-Acetaminophen 5/325 mg  Qty  20 Tablets  PATIENT USES [facility]  Patient is asking if you could increase the quantity.

## 2018-12-19 ENCOUNTER — Telehealth: Payer: Self-pay | Admitting: Orthopaedic Surgery

## 2018-12-19 MED ORDER — HYDROCODONE-ACETAMINOPHEN 5-325 MG PO TABS: ORAL_TABLET | ORAL | 0 refills | Status: DC

## 2018-12-19 NOTE — Telephone Encounter (Signed)
Call received from patient for refill:  HYDROcodone-acetaminophen (NORCO/VICODIN) 5-325 MG tablet 20 tablet  -[facility]

## 2019-01-02 ENCOUNTER — Ambulatory Visit: Payer: BC Managed Care – PPO | Admitting: Orthopaedic Surgery

## 2019-01-02 ENCOUNTER — Encounter: Payer: Self-pay | Admitting: Orthopaedic Surgery

## 2019-01-02 ENCOUNTER — Other Ambulatory Visit: Payer: Self-pay

## 2019-01-02 VITALS — BP 119/78 | HR 79 | Ht 68.0 in | Wt 139.0 lb

## 2019-01-02 DIAGNOSIS — M25512 Pain in left shoulder: Secondary | ICD-10-CM

## 2019-01-02 DIAGNOSIS — G8929 Other chronic pain: Secondary | ICD-10-CM

## 2019-01-02 DIAGNOSIS — M25561 Pain in right knee: Secondary | ICD-10-CM

## 2019-01-02 MED ORDER — HYDROCODONE-ACETAMINOPHEN 5-325 MG PO TABS: ORAL_TABLET | ORAL | 0 refills | Status: DC

## 2019-01-02 NOTE — Progress Notes (Signed)
[name]:Alan Curtis, male DOB:12/11/1985, 33 y.o. VWU:[id number]  Chief Complaint  Patient presents with  . Shoulder Pain    left  . Knee Pain    right     HPI  [name] is a 33 y.o. male who has pain of the left shoulder and right knee.  His left shoulder pain varies according to weather and use.  He has no swelling, no redness, no numbness.  It is worse with rainy weather.  His right knee has started to bother him again with swelling and popping.  He has one incident of giving way.  He has no redness, no trauma.     Body mass index is 21.13 kg/m.  ROS  Review of Systems  Constitutional: Positive for activity change.  HENT: Negative for congestion.   Respiratory: Positive for shortness of breath. Negative for cough.   Cardiovascular: Positive for chest pain and palpitations. Negative for leg swelling.  Endocrine: Positive for cold intolerance.  Musculoskeletal: Positive for arthralgias.  Allergic/Immunologic: Positive for environmental allergies.  All other systems reviewed and are negative.   All other systems reviewed and are negative.  The following is a summary of the past history medically, past history surgically, known current medicines, social history and family history.  This information is gathered electronically by the computer from prior information and documentation.  I review this each visit and have found including this information at this point in the chart is beneficial and informative.    Past Medical History:  Diagnosis Date  . Chest pain 09/09/2015  . Elevated troponin 09/09/2015  . KNEE PAIN 12/02/2008   Qualifier: Diagnosis of  By: Romeo AppleHarrison MD, Duffy RhodyStanley    . NSTEMI (non-ST elevated myocardial infarction) (HCC) 09/09/2015  . Pain in the chest   . Palpitations 09/09/2015  . SVT (supraventricular tachycardia) (HCC)   . Tobacco abuse 09/09/2015    Past Surgical History:  Procedure Laterality Date  . KNEE SURGERY      Family History   Problem Relation Age of Onset  . Hypertension Mother   . Hypertension Father   . Asthma Brother   . Hypertension Maternal Grandmother   . Diabetes Maternal Grandmother     Social History Social History   Tobacco Use  . Smoking status: Current Every Day Smoker    Packs/day: 0.50    Types: Cigarettes  . Smokeless tobacco: Never Used  Substance Use Topics  . Alcohol use: No    Alcohol/week: 0.0 standard drinks  . Drug use: Yes    Types: Marijuana    Comment: Sunday    No Known Allergies  Current Outpatient Medications  Medication Sig Dispense Refill  . HYDROcodone-acetaminophen (NORCO/VICODIN) 5-325 MG tablet TAKE 1 TABLET BY MOUTH EVERY 4 HOURS FOR PAIN. Must last 14 days. 20 tablet 0  . cyclobenzaprine (FLEXERIL) 10 MG tablet Take 1 tablet (10 mg total) by mouth 3 (three) times daily as needed. (Patient not taking: Reported on 01/02/2019) 21 tablet 0  . ibuprofen (ADVIL,MOTRIN) 800 MG tablet Take 1 tablet (800 mg total) by mouth 3 (three) times daily. (Patient not taking: Reported on 01/02/2019) 21 tablet 0  . metoprolol tartrate (LOPRESSOR) 50 MG tablet Take 1 tablet (50 mg total) by mouth 2 (two) times daily. (Patient not taking: Reported on 01/02/2019) 60 tablet 6   No current facility-administered medications for this visit.      Physical Exam  Blood pressure 119/78, pulse 79, height 5\' 8"  (1.727 m), weight 139 lb (  63 kg).  Constitutional: overall normal hygiene, normal nutrition, well developed, normal grooming, normal body habitus. Assistive device:none  Musculoskeletal: gait and station Limp right, muscle tone and strength are normal, no tremors or atrophy is present.  .  Neurological: coordination overall normal.  Deep tendon reflex/nerve stretch intact.  Sensation normal.  Cranial nerves II-XII intact.   Skin:   Normal overall no scars, lesions, ulcers or rashes. No psoriasis.  Psychiatric: Alert and oriented x 3.  Recent memory intact, remote memory unclear.   Normal mood and affect. Well groomed.  Good eye contact.  Cardiovascular: overall no swelling, no varicosities, no edema bilaterally, normal temperatures of the legs and arms, no clubbing, cyanosis and good capillary refill.  Lymphatic: palpation is normal.  Left shoulder has full motion but pain in the extremes.  NV intact.  Right knee is tender medially, has slight effusion, no crepitus, nearly full motion, no limp, NV intact.  The knee is stable.  All other systems reviewed and are negative   The patient has been educated about the nature of the problem(s) and counseled on treatment options.  The patient appeared to understand what I have discussed and is in agreement with it.  Encounter Diagnoses  Name Primary?  . Chronic left shoulder pain Yes  . Chronic pain of right knee     PLAN Call if any problems.  Precautions discussed.  Continue current medications.   Return to clinic 3 months   I have reviewed the [place] web site prior to prescribing narcotic medicine for this patient.   The patient has read and signed an Opioid Treatment Agreement which has been scanned and added to the medical record.  The patient understands the agreement and agrees to abide with it.  The patient has chronic pain that is being treated with an opioid which relieves the pain.  The patient understands potential complications with chronic opioid treatment.   Electronically Signed Sanjuana Kava, MD 6/16/20209:38 AM

## 2019-01-03 ENCOUNTER — Ambulatory Visit: Payer: Self-pay | Admitting: Orthopaedic Surgery

## 2019-01-16 ENCOUNTER — Telehealth: Payer: Self-pay | Admitting: Orthopaedic Surgery

## 2019-01-16 MED ORDER — HYDROCODONE-ACETAMINOPHEN 5-325 MG PO TABS: ORAL_TABLET | ORAL | 0 refills | Status: DC

## 2019-01-16 NOTE — Telephone Encounter (Signed)
Patient  requests refill HYDROcodone-acetaminophen (NORCO/VICODIN) 5-325 MG tablet [facility] 

## 2019-01-30 ENCOUNTER — Telehealth: Payer: Self-pay | Admitting: Orthopaedic Surgery

## 2019-01-30 MED ORDER — HYDROCODONE-ACETAMINOPHEN 5-325 MG PO TABS: ORAL_TABLET | ORAL | 0 refills | Status: DC

## 2019-01-30 NOTE — Telephone Encounter (Signed)
Patient requests refill on Hydrocodone/Acetaminophen 5-325  Mgs. Qty  20  Sig: TAKE 1 TABLET BY MOUTH EVERY 4 HOURS FOR PAIN. Must last 14 days.  Patient states he uses [facility] 

## 2019-02-13 ENCOUNTER — Telehealth: Payer: Self-pay

## 2019-02-13 MED ORDER — HYDROCODONE-ACETAMINOPHEN 5-325 MG PO TABS: ORAL_TABLET | ORAL | 0 refills | Status: DC

## 2019-02-13 NOTE — Telephone Encounter (Signed)
Hydrocodone-Acetaminophen 5/325 mg  Qty 20 Tablets  PATIENT USES [facility]

## 2019-02-26 ENCOUNTER — Telehealth: Payer: Self-pay | Admitting: Orthopaedic Surgery

## 2019-02-26 NOTE — Telephone Encounter (Signed)
Patient requests refill on Hydrocodone/Acetaminophen 5-325  Mgs. Qty  20  Sig: TAKE 1 TABLET BY MOUTH EVERY 4 HOURS FOR PAIN. Must last 14 days.  Patient states he uses [facility] 

## 2019-02-27 MED ORDER — HYDROCODONE-ACETAMINOPHEN 5-325 MG PO TABS: ORAL_TABLET | ORAL | 0 refills | Status: DC

## 2019-03-13 ENCOUNTER — Other Ambulatory Visit: Payer: Self-pay | Admitting: Orthopaedic Surgery

## 2019-03-13 MED ORDER — HYDROCODONE-ACETAMINOPHEN 5-325 MG PO TABS: ORAL_TABLET | ORAL | 0 refills | Status: DC

## 2019-03-13 NOTE — Telephone Encounter (Signed)
Patient requests refill on Hydrocodone/Acetaminophen 5-325  Mgs. Qty  20  Sig: TAKE 1 TABLET BY MOUTH EVERY 4 HOURS FOR PAIN. Must last 14 days.  Patient states he uses [facility] 

## 2019-03-27 ENCOUNTER — Telehealth: Payer: Self-pay | Admitting: Orthopaedic Surgery

## 2019-03-27 MED ORDER — HYDROCODONE-ACETAMINOPHEN 5-325 MG PO TABS: ORAL_TABLET | ORAL | 0 refills | Status: DC

## 2019-03-27 NOTE — Telephone Encounter (Signed)
Patient requests refill on Hydrocodone/Acetaminophen 5-325  Mgs.  Qty 20  Sig: TAKE 1 TABLET BY MOUTH EVERY 4 HOURS FOR PAIN. Must last 14 days.  Patient uses [facility]

## 2019-04-03 ENCOUNTER — Ambulatory Visit: Payer: BC Managed Care – PPO | Admitting: Orthopaedic Surgery

## 2019-04-05 ENCOUNTER — Encounter: Payer: Self-pay | Admitting: Orthopaedic Surgery

## 2019-04-05 ENCOUNTER — Other Ambulatory Visit: Payer: Self-pay

## 2019-04-05 ENCOUNTER — Ambulatory Visit (INDEPENDENT_AMBULATORY_CARE_PROVIDER_SITE_OTHER): Payer: BC Managed Care – PPO | Admitting: Orthopaedic Surgery

## 2019-04-05 VITALS — BP 118/80 | HR 86 | Temp 97.0°F | Ht 68.0 in | Wt 133.0 lb

## 2019-04-05 DIAGNOSIS — G8929 Other chronic pain: Secondary | ICD-10-CM | POA: Diagnosis not present

## 2019-04-05 DIAGNOSIS — M25512 Pain in left shoulder: Secondary | ICD-10-CM

## 2019-04-05 DIAGNOSIS — F17210 Nicotine dependence, cigarettes, uncomplicated: Secondary | ICD-10-CM

## 2019-04-05 NOTE — Patient Instructions (Signed)

## 2019-04-05 NOTE — Progress Notes (Signed)
[name]:Alan Curtis, male DOB:09/30/1985, 33 y.o. VWU:[id number]  Chief Complaint  Patient presents with  . Shoulder Pain    L/ pain is a 4 on pain scale    HPI  Alan Curtis is a 33 y.o. male who has chronic left shoulder pain. He has no swelling, no paresthesias.  He has no new trauma.  He has pain after his twelve hour shift.  He is working six days a week.  He has pain rolling on his left shoulder sleeping.     Body mass index is 20.22 kg/m.  ROS  Review of Systems  Constitutional: Positive for activity change.  HENT: Negative for congestion.   Respiratory: Positive for shortness of breath. Negative for cough.   Cardiovascular: Positive for chest pain and palpitations. Negative for leg swelling.  Endocrine: Positive for cold intolerance.  Musculoskeletal: Positive for arthralgias.  Allergic/Immunologic: Positive for environmental allergies.  All other systems reviewed and are negative.   All other systems reviewed and are negative.  The following is a summary of the past history medically, past history surgically, known current medicines, social history and family history.  This information is gathered electronically by the computer from prior information and documentation.  I review this each visit and have found including this information at this point in the chart is beneficial and informative.    Past Medical History:  Diagnosis Date  . Chest pain 09/09/2015  . Elevated troponin 09/09/2015  . KNEE PAIN 12/02/2008   Qualifier: Diagnosis of  By: Romeo AppleHarrison MD, Duffy RhodyStanley    . NSTEMI (non-ST elevated myocardial infarction) (HCC) 09/09/2015  . Pain in the chest   . Palpitations 09/09/2015  . SVT (supraventricular tachycardia) (HCC)   . Tobacco abuse 09/09/2015    Past Surgical History:  Procedure Laterality Date  . KNEE SURGERY      Family History  Problem Relation Age of Onset  . Hypertension Mother   . Hypertension Father   . Asthma Brother   . Hypertension  Maternal Grandmother   . Diabetes Maternal Grandmother     Social History Social History   Tobacco Use  . Smoking status: Current Every Day Smoker    Packs/day: 0.50    Types: Cigarettes  . Smokeless tobacco: Never Used  Substance Use Topics  . Alcohol use: No    Alcohol/week: 0.0 standard drinks  . Drug use: Yes    Types: Marijuana    Comment: Sunday    No Known Allergies  Current Outpatient Medications  Medication Sig Dispense Refill  . cyclobenzaprine (FLEXERIL) 10 MG tablet Take 1 tablet (10 mg total) by mouth 3 (three) times daily as needed. (Patient not taking: Reported on 01/02/2019) 21 tablet 0  . HYDROcodone-acetaminophen (NORCO/VICODIN) 5-325 MG tablet TAKE 1 TABLET BY MOUTH EVERY 4 HOURS FOR PAIN. Must last 14 days. 20 tablet 0  . ibuprofen (ADVIL,MOTRIN) 800 MG tablet Take 1 tablet (800 mg total) by mouth 3 (three) times daily. (Patient not taking: Reported on 01/02/2019) 21 tablet 0  . metoprolol tartrate (LOPRESSOR) 50 MG tablet Take 1 tablet (50 mg total) by mouth 2 (two) times daily. (Patient not taking: Reported on 01/02/2019) 60 tablet 6   No current facility-administered medications for this visit.      Physical Exam  Blood pressure 118/80, pulse 86, temperature (!) 97 F (36.1 C), height 5\' 8"  (1.727 m), weight 133 lb (60.3 kg).  Constitutional: overall normal hygiene, normal nutrition, well developed, normal grooming, normal body habitus. Assistive  device:none  Musculoskeletal: gait and station Limp none, muscle tone and strength are normal, no tremors or atrophy is present.  .  Neurological: coordination overall normal.  Deep tendon reflex/nerve stretch intact.  Sensation normal.  Cranial nerves II-XII intact.   Skin:   Normal overall no scars, lesions, ulcers or rashes. No psoriasis.  Psychiatric: Alert and oriented x 3.  Recent memory intact, remote memory unclear.  Normal mood and affect. Well groomed.  Good eye contact.  Cardiovascular: overall  no swelling, no varicosities, no edema bilaterally, normal temperatures of the legs and arms, no clubbing, cyanosis and good capillary refill.  Left shoulder has full motion but painful in extremes.  NV intact.  Lymphatic: palpation is normal.  All other systems reviewed and are negative   The patient has been educated about the nature of the problem(s) and counseled on treatment options.  The patient appeared to understand what I have discussed and is in agreement with it.  Encounter Diagnoses  Name Primary?  . Chronic left shoulder pain Yes  . Cigarette nicotine dependence without complication     PLAN Call if any problems.  Precautions discussed.  Continue current medications.   Return to clinic 3 months   Electronically Signed Sanjuana Kava, MD 9/17/20208:44 AM

## 2019-04-19 ENCOUNTER — Other Ambulatory Visit: Payer: Self-pay | Admitting: Orthopedic Surgery

## 2019-04-19 ENCOUNTER — Telehealth: Payer: Self-pay | Admitting: Orthopaedic Surgery

## 2019-04-19 MED ORDER — HYDROCODONE-ACETAMINOPHEN 5-325 MG PO TABS: ORAL_TABLET | ORAL | 0 refills | Status: DC

## 2019-04-19 NOTE — Telephone Encounter (Signed)
Patient of Dr Luna Glasgow, called for refill; aware that while Dr Luna Glasgow is out of clinic, Dr Aline Brochure is reviewing medication requests: HYDROcodone-acetaminophen (NORCO/VICODIN) 5-325 MG tablet -[facility]

## 2019-04-19 NOTE — Telephone Encounter (Signed)
Please send the proper way

## 2019-04-19 NOTE — Telephone Encounter (Signed)
Re-attempting as Rx request

## 2019-04-30 ENCOUNTER — Telehealth: Payer: Self-pay

## 2019-04-30 NOTE — Telephone Encounter (Signed)
Hydrocodone-Acetaminophen  5/325mg   Qty  21 Tablets  PATIENT USES [facility]

## 2019-05-01 MED ORDER — HYDROCODONE-ACETAMINOPHEN 5-325 MG PO TABS: ORAL_TABLET | ORAL | 0 refills | Status: DC

## 2019-05-15 ENCOUNTER — Telehealth: Payer: Self-pay | Admitting: Orthopaedic Surgery

## 2019-05-15 NOTE — Telephone Encounter (Signed)
Patient called for refill:  HYDROcodone-acetaminophen (NORCO/VICODIN) 5-325 MG tablet 20 tablet  -[facility]  

## 2019-05-16 MED ORDER — HYDROCODONE-ACETAMINOPHEN 5-325 MG PO TABS: ORAL_TABLET | ORAL | 0 refills | Status: DC

## 2019-05-21 ENCOUNTER — Other Ambulatory Visit: Payer: Self-pay | Admitting: Orthopedic Surgery

## 2019-05-22 ENCOUNTER — Telehealth: Payer: Self-pay | Admitting: Orthopaedic Surgery

## 2019-05-22 MED ORDER — HYDROCODONE-ACETAMINOPHEN 5-325 MG PO TABS: ORAL_TABLET | ORAL | 0 refills | Status: DC

## 2019-05-22 NOTE — Telephone Encounter (Signed)
Patient requests refill on  HYDROcodone-acetaminophen (NORCO/VICODIN) 5-325 MG tablet 20 tablet  -[facility]   He states he did not get this last week.  He said the pharmacy told him this prescription was denied

## 2019-06-07 ENCOUNTER — Telehealth: Payer: Self-pay | Admitting: Orthopaedic Surgery

## 2019-06-07 MED ORDER — HYDROCODONE-ACETAMINOPHEN 5-325 MG PO TABS: ORAL_TABLET | ORAL | 0 refills | Status: DC

## 2019-06-07 NOTE — Telephone Encounter (Signed)
Patient requests refill on Hydrocodone/Acetaminophen 5-325  Mgs. Qty  20  Sig: TAKE 1 TABLET BY MOUTH EVERY 4 HOURS FOR PAIN. Must last 14 days.  Patient states he uses [facility] 

## 2019-06-26 ENCOUNTER — Other Ambulatory Visit: Payer: Self-pay | Admitting: Radiology

## 2019-06-26 MED ORDER — HYDROCODONE-ACETAMINOPHEN 5-325 MG PO TABS: ORAL_TABLET | ORAL | 0 refills | Status: DC

## 2019-06-26 NOTE — Telephone Encounter (Signed)
Rx refill request

## 2019-07-05 ENCOUNTER — Ambulatory Visit: Payer: BC Managed Care – PPO | Admitting: Orthopaedic Surgery

## 2019-07-10 ENCOUNTER — Encounter: Payer: Self-pay | Admitting: Orthopaedic Surgery

## 2019-07-10 ENCOUNTER — Ambulatory Visit: Payer: BC Managed Care – PPO

## 2019-07-10 ENCOUNTER — Other Ambulatory Visit: Payer: Self-pay

## 2019-07-10 ENCOUNTER — Ambulatory Visit: Payer: BC Managed Care – PPO | Admitting: Orthopaedic Surgery

## 2019-07-10 VITALS — BP 136/87 | HR 75 | Ht 68.0 in | Wt 136.0 lb

## 2019-07-10 DIAGNOSIS — G8929 Other chronic pain: Secondary | ICD-10-CM

## 2019-07-10 DIAGNOSIS — M25562 Pain in left knee: Secondary | ICD-10-CM

## 2019-07-10 MED ORDER — HYDROCODONE-ACETAMINOPHEN 5-325 MG PO TABS: ORAL_TABLET | ORAL | 0 refills | Status: DC

## 2019-07-10 NOTE — Progress Notes (Signed)
Patient Alan Curtis, male DOB:1986-01-14, 33 y.o. SWF:[id number]  Chief Complaint  Patient presents with  . Knee Pain    left     HPI  Alan Curtis is a 33 y.o. male who has developed pain in the left knee.  It is more laterally.  He is working 12 hour shifts.  It hurts at the end of the shift.  He has no trauma, no swelling, no giving way.  He has no redness.  Tylenol has not helped.    His shoulder is stable.   Body mass index is 20.68 kg/m.  ROS  Review of Systems  Constitutional: Positive for activity change.  HENT: Negative for congestion.   Respiratory: Positive for shortness of breath. Negative for cough.   Cardiovascular: Positive for chest pain and palpitations. Negative for leg swelling.  Endocrine: Positive for cold intolerance.  Musculoskeletal: Positive for arthralgias.  Allergic/Immunologic: Positive for environmental allergies.  All other systems reviewed and are negative.   All other systems reviewed and are negative.  The following is a summary of the past history medically, past history surgically, known current medicines, social history and family history.  This information is gathered electronically by the computer from prior information and documentation.  I review this each visit and have found including this information at this point in the chart is beneficial and informative.    Past Medical History:  Diagnosis Date  . Chest pain 09/09/2015  . Elevated troponin 09/09/2015  . KNEE PAIN 12/02/2008   Qualifier: Diagnosis of  By: Romeo Apple MD, Duffy Rhody    . NSTEMI (non-ST elevated myocardial infarction) (HCC) 09/09/2015  . Pain in the chest   . Palpitations 09/09/2015  . SVT (supraventricular tachycardia) (HCC)   . Tobacco abuse 09/09/2015    Past Surgical History:  Procedure Laterality Date  . KNEE SURGERY      Family History  Problem Relation Age of Onset  . Hypertension Mother   . Hypertension Father   . Asthma Brother   . Hypertension  Maternal Grandmother   . Diabetes Maternal Grandmother     Social History Social History   Tobacco Use  . Smoking status: Current Every Day Smoker    Packs/day: 0.50    Types: Cigarettes  . Smokeless tobacco: Never Used  Substance Use Topics  . Alcohol use: No    Alcohol/week: 0.0 standard drinks  . Drug use: Yes    Types: Marijuana    Comment: Sunday    No Known Allergies  Current Outpatient Medications  Medication Sig Dispense Refill  . HYDROcodone-acetaminophen (NORCO/VICODIN) 5-325 MG tablet TAKE 1 TABLET BY MOUTH EVERY 4 HOURS FOR PAIN. Must last 14 days. 20 tablet 0  . metoprolol tartrate (LOPRESSOR) 50 MG tablet Take 1 tablet (50 mg total) by mouth 2 (two) times daily. (Patient not taking: Reported on 01/02/2019) 60 tablet 6   No current facility-administered medications for this visit.     Physical Exam  Blood pressure 136/87, pulse 75, height 5\' 8"  (1.727 m), weight 136 lb (61.7 kg).  Constitutional: overall normal hygiene, normal nutrition, well developed, normal grooming, normal body habitus. Assistive device:none  Musculoskeletal: gait and station Limp none, muscle tone and strength are normal, no tremors or atrophy is present.  .  Neurological: coordination overall normal.  Deep tendon reflex/nerve stretch intact.  Sensation normal.  Cranial nerves II-XII intact.   Skin:   Normal overall no scars, lesions, ulcers or rashes. No psoriasis.  Psychiatric: Alert and oriented x  3.  Recent memory intact, remote memory unclear.  Normal mood and affect. Well groomed.  Good eye contact.  Cardiovascular: overall no swelling, no varicosities, no edema bilaterally, normal temperatures of the legs and arms, no clubbing, cyanosis and good capillary refill.  Lymphatic: palpation is normal.  Left knee with tenderness more laterally but no effusion, no crepitus, full ROM.  No limp.  Knee is stable.  All other systems reviewed and are negative   The patient has been  educated about the nature of the problem(s) and counseled on treatment options.  The patient appeared to understand what I have discussed and is in agreement with it.  Encounter Diagnosis  Name Primary?  . Chronic pain of left knee Yes  x-rays were done of the left knee, reported separately.   PLAN Call if any problems.  Precautions discussed.  Continue current medications. I have asked that he also begin Aleve one bid.  Return to clinic 3 months   I have reviewed the [place] web site prior to prescribing narcotic medicine for this patient.   Electronically Signed Sanjuana Kava, MD 12/22/20208:20 AM

## 2019-07-23 ENCOUNTER — Telehealth: Payer: Self-pay | Admitting: Orthopaedic Surgery

## 2019-07-23 MED ORDER — HYDROCODONE-ACETAMINOPHEN 5-325 MG PO TABS: ORAL_TABLET | ORAL | 0 refills | Status: DC

## 2019-07-23 NOTE — Telephone Encounter (Signed)
Patient requests refill: HYDROcodone-acetaminophen (NORCO/VICODIN) 5-325 MG tablet 20 tablet   - [facility]

## 2019-08-06 ENCOUNTER — Telehealth: Payer: Self-pay | Admitting: Orthopaedic Surgery

## 2019-08-06 MED ORDER — HYDROCODONE-ACETAMINOPHEN 5-325 MG PO TABS: ORAL_TABLET | ORAL | 0 refills | Status: DC

## 2019-08-06 NOTE — Telephone Encounter (Signed)
Patient called for refill:  HYDROcodone-acetaminophen (NORCO/VICODIN) 5-325 MG tablet 20 tablet  -[facility]  

## 2019-08-20 ENCOUNTER — Telehealth: Payer: Self-pay | Admitting: Orthopaedic Surgery

## 2019-08-20 MED ORDER — HYDROCODONE-ACETAMINOPHEN 5-325 MG PO TABS: ORAL_TABLET | ORAL | 0 refills | Status: DC

## 2019-08-20 NOTE — Telephone Encounter (Signed)
Patient called for refill: HYDROcodone-acetaminophen (NORCO/VICODIN) 5-325 MG tablet / 20 tablets  Pharmacy: [facility]

## 2019-08-30 ENCOUNTER — Telehealth: Payer: Self-pay | Admitting: Orthopaedic Surgery

## 2019-08-30 MED ORDER — HYDROCODONE-ACETAMINOPHEN 5-325 MG PO TABS: ORAL_TABLET | ORAL | 0 refills | Status: DC

## 2019-08-30 NOTE — Telephone Encounter (Signed)
Patient requests refill on Hydrocodone/Acetaminophen 5-325  Mgs. Qty  20  Sig: TAKE 1 TABLET BY MOUTH EVERY 4 HOURS FOR PAIN. Must last 14 days.  Patient states he uses [facility]

## 2019-09-17 ENCOUNTER — Telehealth: Payer: Self-pay | Admitting: Orthopaedic Surgery

## 2019-09-17 MED ORDER — HYDROCODONE-ACETAMINOPHEN 5-325 MG PO TABS: ORAL_TABLET | ORAL | 0 refills | Status: DC

## 2019-09-17 NOTE — Telephone Encounter (Signed)
Patient called for refill: HYDROcodone-acetaminophen (NORCO/VICODIN) 5-325 MG tablet 18 tablet  -[facility]

## 2019-09-27 ENCOUNTER — Telehealth: Payer: Self-pay | Admitting: Orthopaedic Surgery

## 2019-09-27 MED ORDER — HYDROCODONE-ACETAMINOPHEN 5-325 MG PO TABS: ORAL_TABLET | ORAL | 0 refills | Status: DC

## 2019-09-27 NOTE — Addendum Note (Signed)
Addended by: Earnstine Regal on: 09/27/2019 03:02 PM   Modules accepted: Orders

## 2019-09-27 NOTE — Telephone Encounter (Signed)
Patient requests refill on Hydrocodone/Acetaminophen 5-325  Mgs.  Qty  18  Sig: TAKE 1 TABLET BY MOUTH EVERY 4 HOURS FOR PAIN. Must last 14 days.  Patient states he uses [facility]

## 2019-10-09 ENCOUNTER — Ambulatory Visit: Payer: BC Managed Care – PPO | Admitting: Orthopaedic Surgery

## 2019-10-09 DIAGNOSIS — Y939 Activity, unspecified: Secondary | ICD-10-CM | POA: Insufficient documentation

## 2019-10-09 DIAGNOSIS — F17210 Nicotine dependence, cigarettes, uncomplicated: Secondary | ICD-10-CM | POA: Insufficient documentation

## 2019-10-09 DIAGNOSIS — Y929 Unspecified place or not applicable: Secondary | ICD-10-CM | POA: Insufficient documentation

## 2019-10-09 DIAGNOSIS — W109XXA Fall (on) (from) unspecified stairs and steps, initial encounter: Secondary | ICD-10-CM | POA: Insufficient documentation

## 2019-10-09 DIAGNOSIS — Y999 Unspecified external cause status: Secondary | ICD-10-CM | POA: Insufficient documentation

## 2019-10-09 DIAGNOSIS — I252 Old myocardial infarction: Secondary | ICD-10-CM | POA: Insufficient documentation

## 2019-10-09 DIAGNOSIS — S62367A Nondisplaced fracture of neck of fifth metacarpal bone, left hand, initial encounter for closed fracture: Secondary | ICD-10-CM | POA: Insufficient documentation

## 2019-10-10 ENCOUNTER — Encounter (HOSPITAL_COMMUNITY): Payer: Self-pay | Admitting: Emergency Medicine

## 2019-10-10 ENCOUNTER — Other Ambulatory Visit: Payer: Self-pay

## 2019-10-10 ENCOUNTER — Emergency Department (HOSPITAL_COMMUNITY)
Admission: EM | Admit: 2019-10-10 | Discharge: 2019-10-10 | Disposition: A | Discharge status: Home / Self Care | Payer: Self-pay | Attending: Emergency Medicine | Admitting: Emergency Medicine

## 2019-10-10 ENCOUNTER — Emergency Department (HOSPITAL_COMMUNITY): Payer: Self-pay

## 2019-10-10 DIAGNOSIS — S62367A Nondisplaced fracture of neck of fifth metacarpal bone, left hand, initial encounter for closed fracture: Secondary | ICD-10-CM

## 2019-10-10 NOTE — ED Provider Notes (Signed)
[facility] EMERGENCY DEPARTMENT Provider Note   CSN: [id number] Arrival date & time: 10/09/19  2359   History Chief Complaint  Patient presents with  . Hand Injury    Alan Curtis is a 34 y.o. male.  The history is provided by the patient.  Hand Injury He injured his left hand when he fell going down some steps.  He is complaining of pain in the ulnar aspect of the hand.  The hand is swollen but not very painful if he does not try to move it.  He rates pain at 2/10.  He denies other injury.  Past Medical History:  Diagnosis Date  . Chest pain 09/09/2015  . Elevated troponin 09/09/2015  . KNEE PAIN 12/02/2008   Qualifier: Diagnosis of  By: Romeo Apple MD, Duffy Rhody    . NSTEMI (non-ST elevated myocardial infarction) (HCC) 09/09/2015  . Pain in the chest   . Palpitations 09/09/2015  . SVT (supraventricular tachycardia) (HCC)   . Tobacco abuse 09/09/2015    Patient Active Problem List   Diagnosis Date Noted  . Pain in the chest   . NSTEMI (non-ST elevated myocardial infarction) (HCC) 09/09/2015  . Chest pain 09/09/2015  . Elevated troponin 09/09/2015  . Palpitations 09/09/2015  . Tobacco abuse 09/09/2015  . KNEE PAIN 12/02/2008    Past Surgical History:  Procedure Laterality Date  . KNEE SURGERY         Family History  Problem Relation Age of Onset  . Hypertension Mother   . Hypertension Father   . Asthma Brother   . Hypertension Maternal Grandmother   . Diabetes Maternal Grandmother     Social History   Tobacco Use  . Smoking status: Current Every Day Smoker    Packs/day: 0.50    Types: Cigarettes  . Smokeless tobacco: Never Used  Substance Use Topics  . Alcohol use: No    Alcohol/week: 0.0 standard drinks  . Drug use: Yes    Types: Marijuana    Comment: Sunday    Home Medications Prior to Admission medications   Medication Sig Start Date End Date Taking? Authorizing Provider  metoprolol tartrate (LOPRESSOR) 50 MG tablet Take 1 tablet (50 mg total)  by mouth 2 (two) times daily. Patient not taking: Reported on 01/02/2019 02/15/17 10/10/19  Darreld Mclean, MD    Allergies    Patient has no known allergies.  Review of Systems   Review of Systems  All other systems reviewed and are negative.   Physical Exam Updated Vital Signs BP (!) 135/95 (BP Location: Right Arm)   Pulse 95   Temp 98.2 F (36.8 C) (Oral)   Resp 17   Ht 5\' 8"  (1.727 m)   Wt 63.5 kg   SpO2 99%   BMI 21.29 kg/m   Physical Exam Vitals and nursing note reviewed.   34 year old male, resting comfortably and in no acute distress. Vital signs are significant for borderline elevated blood pressure. Oxygen saturation is 99%, which is normal. Head is normocephalic and atraumatic. PERRLA, EOMI. Oropharynx is clear. Neck is nontender and supple without adenopathy or JVD. Back is nontender and there is no CVA tenderness. Lungs are clear without rales, wheezes, or rhonchi. Chest is nontender. Heart has regular rate and rhythm without murmur. Abdomen is soft, flat, nontender without masses or hepatosplenomegaly and peristalsis is normoactive. Extremities: There is moderate swelling on the ulnar aspect of the left hand with tenderness to palpation over the distal fifth metacarpal.  He is able  to make a fist without any rotational deformity noted of the fingers.  Distal neurovascular exam is intact.  Remainder of extremity exam is normal. Skin is warm and dry without rash. Neurologic: Mental status is normal, cranial nerves are intact, there are no motor or sensory deficits.  ED Results / Procedures / Treatments    Radiology DG Hand Complete Left  Result Date: 10/10/2019 CLINICAL DATA:  Hand pain after fall EXAM: LEFT HAND - COMPLETE 3+ VIEW COMPARISON:  None. FINDINGS: Nondisplaced mildly angulated fracture seen through the fifth metacarpal head neck junction. Overlying soft tissue swelling is seen. No fracture is noted. IMPRESSION: Nondisplaced mildly angulated fifth  metacarpal head neck fracture with overlying soft tissue swelling. Electronically Signed   By: Prudencio Pair M.D.   On: 10/10/2019 00:30   Procedures .Splint Application  Date/Time: 10/10/2019 12:54 AM Performed by: Delora Fuel, MD Authorized by: Delora Fuel, MD   Consent:    Consent obtained:  Verbal   Consent given by:  Patient   Risks discussed:  Pain   Alternatives discussed:  No treatment Pre-procedure details:    Sensation:  Normal   Skin color:  Normal Procedure details:    Laterality:  Left   Location:  Arm   Arm:  L lower arm   Strapping: no     Splint type:  Ulnar gutter   Supplies:  Ortho-Glass, elastic bandage and cotton padding Post-procedure details:    Sensation:  Normal   Skin color:  Normal   Patient tolerance of procedure:  Tolerated well, no immediate complications Comments:     Splint applied by RN, neurovascular status assessed by me following splint application.     Medications Ordered in ED Medications - No data to display  ED Course  I have reviewed the triage vital signs and the nursing notes.  Pertinent imaging results that were available during my care of the patient were reviewed by me and considered in my medical decision making (see chart for details).  MDM Rules/Calculators/A&P Fall with injury of left hand.  X-ray shows fracture of the neck of the fifth metacarpal with only mild angulation.  He is placed in an ulnar gutter splint and will be referred to hand surgery for follow-up.  Advised on ice and elevation, use over-the-counter analgesics as needed for pain.  Final Clinical Impression(s) / ED Diagnoses Final diagnoses:  Nondisplaced fracture of neck of fifth metacarpal bone, left hand, initial encounter for closed fracture    Rx / DC Orders ED Discharge Orders    None       Delora Fuel, MD 02/63/78 [phone]

## 2019-10-10 NOTE — Discharge Instructions (Signed)
Apply ice for 30 minutes at a time, 4 times a day.  Keep your hand elevated as much as possible.  Take ibuprofen and/or acetaminophen as needed for pain.

## 2019-10-10 NOTE — ED Triage Notes (Signed)
Pt C/O left hand pain after tripping and falling onto the hand. Swelling noted.

## 2019-10-11 ENCOUNTER — Ambulatory Visit: Payer: Self-pay | Admitting: Orthopaedic Surgery

## 2019-10-11 ENCOUNTER — Encounter: Payer: Self-pay | Admitting: Orthopaedic Surgery

## 2019-10-11 ENCOUNTER — Telehealth: Payer: Self-pay | Admitting: Orthopaedic Surgery

## 2019-10-11 VITALS — BP 137/91 | HR 109 | Ht 68.0 in | Wt 137.0 lb

## 2019-10-11 DIAGNOSIS — W108XXA Fall (on) (from) other stairs and steps, initial encounter: Secondary | ICD-10-CM

## 2019-10-11 DIAGNOSIS — F17210 Nicotine dependence, cigarettes, uncomplicated: Secondary | ICD-10-CM

## 2019-10-11 DIAGNOSIS — S62367A Nondisplaced fracture of neck of fifth metacarpal bone, left hand, initial encounter for closed fracture: Secondary | ICD-10-CM

## 2019-10-11 MED ORDER — HYDROCODONE-ACETAMINOPHEN 5-325 MG PO TABS: ORAL_TABLET | ORAL | 0 refills | Status: DC

## 2019-10-11 NOTE — Telephone Encounter (Signed)
I took care of it.  He can pick up soon.

## 2019-10-11 NOTE — Telephone Encounter (Signed)
Alan Curtis came back by the office and said that they would not fill his pain medication because "there was a problem with the directions".  He said they told him to have Dr Hilda Lias call them at the pharmacy  Please call [facility] at [phone]  Thanks

## 2019-10-11 NOTE — Progress Notes (Signed)
Patient PN:Alan Curtis, male DOB:[birth year], 34 y.o. XVQ:[id number]  Chief Complaint  Patient presents with  . Hand Injury    left 10/09/19   . Medication Refill    hydrocodone    HPI  Alan Curtis is a 34 y.o. male who fell going up steps and hurt his left nondominant hand.  He was seen in the ER yesterday and has nondisplaced fracture of left fifth metacarpal head area.  He was placed in small splint.  He has no other injury.    Body mass index is 20.83 kg/m.  ROS  Review of Systems  Constitutional: Positive for activity change.  HENT: Negative for congestion.   Respiratory: Positive for shortness of breath. Negative for cough.   Cardiovascular: Positive for chest pain and palpitations. Negative for leg swelling.  Endocrine: Positive for cold intolerance.  Musculoskeletal: Positive for arthralgias.  Allergic/Immunologic: Positive for environmental allergies.  All other systems reviewed and are negative.   All other systems reviewed and are negative.  The following is a summary of the past history medically, past history surgically, known current medicines, social history and family history.  This information is gathered electronically by the computer from prior information and documentation.  I review this each visit and have found including this information at this point in the chart is beneficial and informative.    Past Medical History:  Diagnosis Date  . Chest pain 09/09/2015  . Elevated troponin 09/09/2015  . KNEE PAIN 12/02/2008   Qualifier: Diagnosis of  By: Romeo Apple MD, Duffy Rhody    . NSTEMI (non-ST elevated myocardial infarction) (HCC) 09/09/2015  . Pain in the chest   . Palpitations 09/09/2015  . SVT (supraventricular tachycardia) (HCC)   . Tobacco abuse 09/09/2015    Past Surgical History:  Procedure Laterality Date  . KNEE SURGERY      Family History  Problem Relation Age of Onset  . Hypertension Mother   . Hypertension Father   . Asthma Brother    . Hypertension Maternal Grandmother   . Diabetes Maternal Grandmother     Social History Social History   Tobacco Use  . Smoking status: Current Every Day Smoker    Packs/day: 0.50    Types: Cigarettes  . Smokeless tobacco: Never Used  Substance Use Topics  . Alcohol use: No    Alcohol/week: 0.0 standard drinks  . Drug use: Yes    Types: Marijuana    Comment: Sunday    No Known Allergies  Current Outpatient Medications  Medication Sig Dispense Refill  . HYDROcodone-acetaminophen (NORCO/VICODIN) 5-325 MG tablet Take 1 tablet by mouth every 6 (six) hours as needed for moderate pain.     No current facility-administered medications for this visit.     Physical Exam  Blood pressure (!) 137/91, pulse (!) 109, height 5\' 8"  (1.727 m), weight 137 lb (62.1 kg).  Constitutional: overall normal hygiene, normal nutrition, well developed, normal grooming, normal body habitus. Assistive device:left hand splint  Musculoskeletal: gait and station Limp none, muscle tone and strength are normal, no tremors or atrophy is present.  .  Neurological: coordination overall normal.  Deep tendon reflex/nerve stretch intact.  Sensation normal.  Cranial nerves II-XII intact.   Skin:   Normal overall no scars, lesions, ulcers or rashes. No psoriasis.  Psychiatric: Alert and oriented x 3.  Recent memory intact, remote memory unclear.  Normal mood and affect. Well groomed.  Good eye contact.  Cardiovascular: overall no swelling, no varicosities, no edema bilaterally, normal  temperatures of the legs and arms, no clubbing, cyanosis and good capillary refill.  Lymphatic: palpation is normal.  Left hand tender, more over fifth metacarpal distally, ecchymosis, swelling, decreased ROM of fingers.  NV intact.    All other systems reviewed and are negative   The patient has been educated about the nature of the problem(s) and counseled on treatment options.  The patient appeared to understand what I  have discussed and is in agreement with it.  Encounter Diagnoses  Name Primary?  . Closed nondisplaced fracture of neck of fifth metacarpal bone of left hand, initial encounter Yes  . Cigarette nicotine dependence without complication     PLAN Call if any problems.  Precautions discussed.  Continue current medications.   Return to clinic 2 weeks   X-rays out of splint on return.  An ulnar gutter splint was applied.  Pain medicine given.  I have reviewed the [place] web site prior to prescribing narcotic medicine for this patient.   Electronically Signed Alan Kava, MD 3/25/20218:20 AM

## 2019-10-18 ENCOUNTER — Ambulatory Visit: Payer: Self-pay | Admitting: Orthopaedic Surgery

## 2019-10-25 ENCOUNTER — Encounter: Payer: Self-pay | Admitting: Orthopaedic Surgery

## 2019-10-25 ENCOUNTER — Ambulatory Visit: Payer: Self-pay | Admitting: Orthopaedic Surgery

## 2019-10-25 ENCOUNTER — Ambulatory Visit: Payer: Self-pay

## 2019-10-25 ENCOUNTER — Other Ambulatory Visit: Payer: Self-pay | Admitting: Orthopaedic Surgery

## 2019-10-25 ENCOUNTER — Other Ambulatory Visit: Payer: Self-pay

## 2019-10-25 DIAGNOSIS — S62367D Nondisplaced fracture of neck of fifth metacarpal bone, left hand, subsequent encounter for fracture with routine healing: Secondary | ICD-10-CM

## 2019-10-25 NOTE — Progress Notes (Signed)
My hand is not as sore  He has been using the splint on the left hand.  He has no new trauma.  NV intact.  There is no rotary changes of the little finger.  X-rays were done of the left hand, reported separately.  Encounter Diagnosis  Name Primary?  . Closed nondisplaced fracture of neck of fifth metacarpal bone of left hand with routine healing, subsequent encounter Yes   A [place] splint applied.  Instructions given.  Return in two weeks.  X-rays then of the left hand.  Call if any problem.  Precautions discussed.   Electronically Signed Darreld Mclean, MD 4/8/20219:06 AM

## 2019-10-29 ENCOUNTER — Telehealth: Payer: Self-pay | Admitting: Orthopaedic Surgery

## 2019-10-29 MED ORDER — HYDROCODONE-ACETAMINOPHEN 5-325 MG PO TABS: ORAL_TABLET | ORAL | 0 refills | Status: DC

## 2019-10-29 NOTE — Telephone Encounter (Signed)
Champion called regarding his pain medication.  He states this has not been done.   Were you not going to give him further pain medication or does the request need to be sent to you again?  Please let me know so that I can advise the patient.  Thanks

## 2019-11-08 ENCOUNTER — Ambulatory Visit (INDEPENDENT_AMBULATORY_CARE_PROVIDER_SITE_OTHER): Payer: Self-pay

## 2019-11-08 ENCOUNTER — Ambulatory Visit (INDEPENDENT_AMBULATORY_CARE_PROVIDER_SITE_OTHER): Payer: Self-pay | Admitting: Orthopaedic Surgery

## 2019-11-08 ENCOUNTER — Other Ambulatory Visit: Payer: Self-pay

## 2019-11-08 ENCOUNTER — Encounter: Payer: Self-pay | Admitting: Orthopaedic Surgery

## 2019-11-08 VITALS — Ht 68.0 in | Wt 148.0 lb

## 2019-11-08 DIAGNOSIS — S62367D Nondisplaced fracture of neck of fifth metacarpal bone, left hand, subsequent encounter for fracture with routine healing: Secondary | ICD-10-CM

## 2019-11-08 NOTE — Progress Notes (Signed)
My hand is better  He has been using the [facility] Splint.  He has no pain, no new injury.  NV intact. ROM is good in the splint.  X-rays were done of the left hand, reported separately.  Encounter Diagnosis  Name Primary?  . Closed nondisplaced fracture of neck of fifth metacarpal bone of left hand with routine healing, subsequent encounter Yes   Return in three weeks. X-rays then.  Continue the [facility] Splint.  Call if any problem.  Precautions discussed.   Electronically Signed Darreld Mclean, MD 4/22/20219:12 AM

## 2019-11-13 ENCOUNTER — Telehealth: Payer: Self-pay | Admitting: Orthopaedic Surgery

## 2019-11-13 MED ORDER — HYDROCODONE-ACETAMINOPHEN 5-325 MG PO TABS: ORAL_TABLET | ORAL | 0 refills | Status: DC

## 2019-11-13 NOTE — Telephone Encounter (Signed)
Patient requests refill on Hydrocodone/Acetaminophen 5-325   Mgs.   Qty  56  Sig: One tablet by mouth every six hours as needed for pain.  Patient uses [facility]

## 2019-11-29 ENCOUNTER — Encounter: Payer: Self-pay | Admitting: Orthopaedic Surgery

## 2019-11-29 ENCOUNTER — Ambulatory Visit (INDEPENDENT_AMBULATORY_CARE_PROVIDER_SITE_OTHER): Payer: Self-pay

## 2019-11-29 ENCOUNTER — Other Ambulatory Visit: Payer: Self-pay

## 2019-11-29 ENCOUNTER — Ambulatory Visit: Payer: Self-pay | Admitting: Orthopaedic Surgery

## 2019-11-29 DIAGNOSIS — S62367D Nondisplaced fracture of neck of fifth metacarpal bone, left hand, subsequent encounter for fracture with routine healing: Secondary | ICD-10-CM

## 2019-11-29 MED ORDER — HYDROCODONE-ACETAMINOPHEN 5-325 MG PO TABS: ORAL_TABLET | ORAL | 0 refills | Status: DC

## 2019-11-29 NOTE — Progress Notes (Signed)
My hand is better  He has full ROM of the left little finger and no pain.  Rotation is normal.  NV intact.  X-rays were done of the left hand, reported separately.  Encounter Diagnosis  Name Primary?  . Closed nondisplaced fracture of neck of fifth metacarpal bone of left hand with routine healing, subsequent encounter Yes   I will see him in six weeks for the left shoulder pain.  I have reviewed the [place] Controlled Substance Reporting System web site prior to prescribing narcotic medicine for this patient.    Electronically Signed Darreld Mclean, MD 5/13/20219:37 AM

## 2019-12-12 ENCOUNTER — Telehealth: Payer: Self-pay | Admitting: Orthopaedic Surgery

## 2019-12-12 NOTE — Telephone Encounter (Signed)
Patient requests refill on Hydrocodone/Acetaminophen 5-325  Mgs.  Qty  28      Sig: One tablet every six hours for pain. Limit 7 days.    Patient states he uses [facility]

## 2019-12-13 MED ORDER — HYDROCODONE-ACETAMINOPHEN 5-325 MG PO TABS: ORAL_TABLET | ORAL | 0 refills | Status: DC

## 2019-12-26 ENCOUNTER — Telehealth: Payer: Self-pay | Admitting: Orthopaedic Surgery

## 2019-12-26 MED ORDER — HYDROCODONE-ACETAMINOPHEN 5-325 MG PO TABS: ORAL_TABLET | ORAL | 0 refills | Status: DC

## 2019-12-26 NOTE — Telephone Encounter (Signed)
Patient requests refill on Hydrocodone/Acetaminophen 5-325  Mgs.  Qty  20  Sig: One tablet every six hours for pain. Limit 7 days.  Patient states he uses [facility] 

## 2020-01-03 ENCOUNTER — Telehealth: Payer: Self-pay | Admitting: Orthopaedic Surgery

## 2020-01-03 MED ORDER — HYDROCODONE-ACETAMINOPHEN 5-325 MG PO TABS: ORAL_TABLET | ORAL | 0 refills | Status: DC

## 2020-01-03 NOTE — Telephone Encounter (Signed)
Done

## 2020-01-03 NOTE — Telephone Encounter (Signed)
Patient called for refill:  HYDROcodone-acetaminophen (NORCO/VICODIN) 5-325 MG tablet 20 tablet  -[facility]  

## 2020-01-10 ENCOUNTER — Other Ambulatory Visit: Payer: Self-pay

## 2020-01-10 ENCOUNTER — Encounter: Payer: Self-pay | Admitting: Orthopaedic Surgery

## 2020-01-10 ENCOUNTER — Ambulatory Visit (INDEPENDENT_AMBULATORY_CARE_PROVIDER_SITE_OTHER): Payer: Self-pay | Admitting: Orthopaedic Surgery

## 2020-01-10 VITALS — BP 123/88 | HR 72 | Ht 68.0 in | Wt 140.0 lb

## 2020-01-10 DIAGNOSIS — G8929 Other chronic pain: Secondary | ICD-10-CM

## 2020-01-10 DIAGNOSIS — M25562 Pain in left knee: Secondary | ICD-10-CM

## 2020-01-10 DIAGNOSIS — F17210 Nicotine dependence, cigarettes, uncomplicated: Secondary | ICD-10-CM

## 2020-01-10 NOTE — Progress Notes (Signed)
Patient Alan Curtis, male DOB:February 02, 1986, 34 y.o. IOE:[id number]  Chief Complaint  Patient presents with  . Shoulder Pain    left / difficult to understand patient today     HPI  Alan Curtis is a 34 y.o. male who has chronic left shoulder pain. He has no recent trauma, no swelling, no paresthesias.  He is taking his medicine and doing his exercises.  His hand is better and he has good grip on the left.   Body mass index is 21.29 kg/m.  ROS  Review of Systems  Constitutional: Positive for activity change.  HENT: Negative for congestion.   Respiratory: Positive for shortness of breath. Negative for cough.   Cardiovascular: Positive for chest pain and palpitations. Negative for leg swelling.  Endocrine: Positive for cold intolerance.  Musculoskeletal: Positive for arthralgias.  Allergic/Immunologic: Positive for environmental allergies.  All other systems reviewed and are negative.   All other systems reviewed and are negative.  The following is a summary of the past history medically, past history surgically, known current medicines, social history and family history.  This information is gathered electronically by the computer from prior information and documentation.  I review this each visit and have found including this information at this point in the chart is beneficial and informative.    Past Medical History:  Diagnosis Date  . Chest pain 09/09/2015  . Elevated troponin 09/09/2015  . KNEE PAIN 12/02/2008   Qualifier: Diagnosis of  By: Romeo Apple MD, Duffy Rhody    . NSTEMI (non-ST elevated myocardial infarction) (HCC) 09/09/2015  . Pain in the chest   . Palpitations 09/09/2015  . SVT (supraventricular tachycardia) (HCC)   . Tobacco abuse 09/09/2015    Past Surgical History:  Procedure Laterality Date  . KNEE SURGERY      Family History  Problem Relation Age of Onset  . Hypertension Mother   . Hypertension Father   . Asthma Brother   . Hypertension  Maternal Grandmother   . Diabetes Maternal Grandmother     Social History Social History   Tobacco Use  . Smoking status: Current Every Day Smoker    Packs/day: 0.50    Types: Cigarettes  . Smokeless tobacco: Never Used  Vaping Use  . Vaping Use: Never used  Substance Use Topics  . Alcohol use: No    Alcohol/week: 0.0 standard drinks  . Drug use: Yes    Types: Marijuana    Comment: Sunday    No Known Allergies  Current Outpatient Medications  Medication Sig Dispense Refill  . HYDROcodone-acetaminophen (NORCO/VICODIN) 5-325 MG tablet One tablet every six hours for pain.  Limit 7 days. 20 tablet 0   No current facility-administered medications for this visit.     Physical Exam  Blood pressure 123/88, pulse 72, height 5\' 8"  (1.727 m), weight 140 lb (63.5 kg).  Constitutional: overall normal hygiene, normal nutrition, well developed, normal grooming, normal body habitus. Assistive device:none  Musculoskeletal: gait and station Limp none, muscle tone and strength are normal, no tremors or atrophy is present.  .  Neurological: coordination overall normal.  Deep tendon reflex/nerve stretch intact.  Sensation normal.  Cranial nerves II-XII intact.   Skin:   Normal overall no scars, lesions, ulcers or rashes. No psoriasis.  Psychiatric: Alert and oriented x 3.  Recent memory intact, remote memory unclear.  Normal mood and affect. Well groomed.  Good eye contact.  Cardiovascular: overall no swelling, no varicosities, no edema bilaterally, normal temperatures of the legs and  arms, no clubbing, cyanosis and good capillary refill.  Lymphatic: palpation is normal.  Left shoulder has good ROM with pain in the extremes.  NV intact.  ROM of left hand full.  All other systems reviewed and are negative   The patient has been educated about the nature of the problem(s) and counseled on treatment options.  The patient appeared to understand what I have discussed and is in agreement  with it.  Encounter Diagnoses  Name Primary?  . Chronic pain of left knee Yes  . Cigarette nicotine dependence without complication     PLAN Call if any problems.  Precautions discussed.  Continue current medications.   Return to clinic 3 months   Electronically Signed Sanjuana Kava, MD 6/24/20219:29 AM

## 2020-01-15 ENCOUNTER — Telehealth: Payer: Self-pay | Admitting: Orthopaedic Surgery

## 2020-01-15 MED ORDER — HYDROCODONE-ACETAMINOPHEN 5-325 MG PO TABS: ORAL_TABLET | ORAL | 0 refills | Status: AC

## 2020-01-15 NOTE — Telephone Encounter (Signed)
Patient requests refill on Hydrocodone/Acetaminophen 5-325  Mgs.  Qty  20  Sig: One tablet every six hours for pain. Limit 7 days.  Patient states he uses [facility]

## 2020-01-23 ENCOUNTER — Telehealth: Payer: Self-pay | Admitting: Orthopaedic Surgery

## 2020-01-23 NOTE — Telephone Encounter (Signed)
Patient requests refill on Hydrocodone/Acetaminophen 5-325  Mgs.  Qty  20  Sig: One tablet every six hours for pain. Limit 7 days.  Patient states [facility] uses  [facility] 

## 2020-01-24 NOTE — Telephone Encounter (Signed)
No more narcotics 

## 2020-01-28 ENCOUNTER — Other Ambulatory Visit: Payer: Self-pay

## 2020-01-28 ENCOUNTER — Encounter (HOSPITAL_COMMUNITY): Payer: Self-pay | Admitting: Emergency Medicine

## 2020-01-28 ENCOUNTER — Emergency Department (HOSPITAL_COMMUNITY)
Admission: EM | Admit: 2020-01-28 | Discharge: 2020-01-28 | Disposition: A | Discharge status: Home / Self Care | Payer: Self-pay | Attending: Emergency Medicine | Admitting: Emergency Medicine

## 2020-01-28 ENCOUNTER — Emergency Department (HOSPITAL_COMMUNITY): Payer: Self-pay

## 2020-01-28 DIAGNOSIS — I471 Supraventricular tachycardia: Secondary | ICD-10-CM | POA: Insufficient documentation

## 2020-01-28 DIAGNOSIS — F17210 Nicotine dependence, cigarettes, uncomplicated: Secondary | ICD-10-CM | POA: Insufficient documentation

## 2020-01-28 LAB — COMPREHENSIVE METABOLIC PANEL
ALT: 12 U/L (ref 0–44)
AST: 14 U/L — ABNORMAL LOW (ref 15–41)
Albumin: 4 g/dL (ref 3.5–5.0)
Alkaline Phosphatase: 84 U/L (ref 38–126)
Anion gap: 10 (ref 5–15)
BUN: 10 mg/dL (ref 6–20)
CO2: 23 mmol/L (ref 22–32)
Calcium: 8.5 mg/dL — ABNORMAL LOW (ref 8.9–10.3)
Chloride: 108 mmol/L (ref 98–111)
Creatinine, Ser: 1.33 mg/dL — ABNORMAL HIGH (ref 0.61–1.24)
GFR calc Af Amer: >60 mL/min (ref >60)
GFR calc non Af Amer: >60 mL/min (ref >60)
Glucose, Bld: 123 mg/dL — ABNORMAL HIGH (ref 70–99)
Potassium: 3.6 mmol/L (ref 3.5–5.1)
Sodium: 141 mmol/L (ref 135–145)
Total Bilirubin: 0.5 mg/dL (ref 0.3–1.2)
Total Protein: 7.6 g/dL (ref 6.5–8.1)

## 2020-01-28 LAB — CBC
HCT: 42.7 % (ref 39.0–52.0)
Hemoglobin: 13.6 g/dL (ref 13.0–17.0)
MCH: 29.5 pg (ref 26.0–34.0)
MCHC: 31.9 g/dL (ref 30.0–36.0)
MCV: 92.6 fL (ref 80.0–100.0)
Platelets: 338 10*3/uL (ref 150–400)
RBC: 4.61 MIL/uL (ref 4.22–5.81)
RDW: 14.8 % (ref 11.5–15.5)
WBC: 15.3 10*3/uL — ABNORMAL HIGH (ref 4.0–10.5)
nRBC: 0 % (ref 0.0–0.2)

## 2020-01-28 MED ORDER — ADENOSINE 6 MG/2ML IV SOLN: 12.0000 mg | Freq: Once | INTRAVENOUS | Status: AC

## 2020-01-28 MED ORDER — METOPROLOL TARTRATE 25 MG PO TABS
25.0000 mg | ORAL_TABLET | Freq: Once | ORAL | Status: AC
  Administered 2020-01-28: 25 mg via ORAL
  Filled 2020-01-28: qty 1

## 2020-01-28 MED ORDER — ADENOSINE 6 MG/2ML IV SOLN
INTRAVENOUS | Status: AC
  Filled 2020-01-28: qty 2

## 2020-01-28 MED ORDER — SODIUM CHLORIDE 0.9 % IV SOLN: INTRAVENOUS | Status: DC

## 2020-01-28 MED ORDER — SODIUM CHLORIDE 0.9 % IV BOLUS
500.0000 mL | Freq: Once | INTRAVENOUS | Status: AC
  Administered 2020-01-28: 500 mL via INTRAVENOUS

## 2020-01-28 MED ORDER — ADENOSINE 6 MG/2ML IV SOLN
12.0000 mg | Freq: Once | INTRAVENOUS | Status: AC
  Administered 2020-01-28: 12 mg via INTRAVENOUS

## 2020-01-28 MED ORDER — METOPROLOL TARTRATE 25 MG PO TABS: 25.0000 mg | ORAL_TABLET | Freq: Two times a day (BID) | ORAL | 2 refills | Status: AC

## 2020-01-28 NOTE — ED Provider Notes (Addendum)
[facility] EMERGENCY DEPARTMENT Provider Note   CSN: [id number] Arrival date & time: 01/28/20  1722     History Chief Complaint  Patient presents with  . Chest Pain    Alan Curtis is a 34 y.o. male.  Patient with prior history of SVT.  Patient states that he had onset of rapid heart rate shortly prior to arrival.  Associated with some chest discomfort.  No chest discomfort now.  In triage patient's heart rate was in the 225 range.  Patient brought back to room.  Cardiac monitor significant system with SVT.  Patient's prior history reviewed.  In 2017 he was referred to cardiology for ablation.  Is supposed to be on meta propanol 25 mg twice daily.  But has been off of that for a while.  Patient was prepped for adenosine inversion.        Past Medical History:  Diagnosis Date  . Chest pain 09/09/2015  . Elevated troponin 09/09/2015  . KNEE PAIN 12/02/2008   Qualifier: Diagnosis of  By: Romeo Apple MD, Duffy Rhody    . NSTEMI (non-ST elevated myocardial infarction) (HCC) 09/09/2015  . Pain in the chest   . Palpitations 09/09/2015  . SVT (supraventricular tachycardia) (HCC)   . Tobacco abuse 09/09/2015    Patient Active Problem List   Diagnosis Date Noted  . Pain in the chest   . NSTEMI (non-ST elevated myocardial infarction) (HCC) 09/09/2015  . Chest pain 09/09/2015  . Elevated troponin 09/09/2015  . Palpitations 09/09/2015  . Tobacco abuse 09/09/2015  . KNEE PAIN 12/02/2008    Past Surgical History:  Procedure Laterality Date  . KNEE SURGERY         Family History  Problem Relation Age of Onset  . Hypertension Mother   . Hypertension Father   . Asthma Brother   . Hypertension Maternal Grandmother   . Diabetes Maternal Grandmother     Social History   Tobacco Use  . Smoking status: Current Every Day Smoker    Packs/day: 0.50    Types: Cigarettes  . Smokeless tobacco: Never Used  Vaping Use  . Vaping Use: Never used  Substance Use Topics  . Alcohol use:  No    Alcohol/week: 0.0 standard drinks  . Drug use: Yes    Types: Marijuana    Comment: Sunday    Home Medications Prior to Admission medications   Medication Sig Start Date End Date Taking? Authorizing Provider  HYDROcodone-acetaminophen (NORCO/VICODIN) 5-325 MG tablet One tablet every six hours for pain.  Limit 7 days. 01/15/20   Darreld Mclean, MD  metoprolol tartrate (LOPRESSOR) 50 MG tablet Take 1 tablet (50 mg total) by mouth 2 (two) times daily. Patient not taking: Reported on 01/02/2019 02/15/17 10/10/19  Darreld Mclean, MD    Allergies    Patient has no known allergies.  Review of Systems   Review of Systems  Constitutional: Negative for chills and fever.  HENT: Negative for rhinorrhea and sore throat.   Eyes: Negative for visual disturbance.  Respiratory: Negative for cough and shortness of breath.   Cardiovascular: Positive for chest pain and palpitations. Negative for leg swelling.  Gastrointestinal: Negative for abdominal pain, diarrhea, nausea and vomiting.  Genitourinary: Negative for dysuria.  Musculoskeletal: Negative for back pain and neck pain.  Skin: Negative for rash.  Neurological: Negative for dizziness, light-headedness and headaches.  Hematological: Does not bruise/bleed easily.  Psychiatric/Behavioral: Negative for confusion.    Physical Exam Updated Vital Signs BP 112/84   Pulse 79  Temp 98.1 F (36.7 C) (Oral)   Resp 15   Ht 1.727 m (5\' 8" )   Wt 63.5 kg   SpO2 100%   BMI 21.29 kg/m   Physical Exam Vitals and nursing note reviewed.  Constitutional:      General: He is not in acute distress.    Appearance: He is well-developed.  HENT:     Head: Normocephalic and atraumatic.  Eyes:     Extraocular Movements: Extraocular movements intact.     Conjunctiva/sclera: Conjunctivae normal.     Pupils: Pupils are equal, round, and reactive to light.  Cardiovascular:     Rate and Rhythm: Regular rhythm. Tachycardia present.     Heart sounds: No  murmur heard.   Pulmonary:     Effort: Pulmonary effort is normal. No respiratory distress.     Breath sounds: Normal breath sounds.  Abdominal:     Palpations: Abdomen is soft.     Tenderness: There is no abdominal tenderness.  Musculoskeletal:        General: No swelling. Normal range of motion.     Cervical back: Normal range of motion and neck supple.  Skin:    General: Skin is warm and dry.     Capillary Refill: Capillary refill takes less than 2 seconds.  Neurological:     General: No focal deficit present.     Mental Status: He is alert and oriented to person, place, and time.     Cranial Nerves: No cranial nerve deficit.     Sensory: No sensory deficit.     ED Results / Procedures / Treatments   Labs (all labs ordered are listed, but only abnormal results are displayed) Labs Reviewed  COMPREHENSIVE METABOLIC PANEL  CBC    EKG None  Radiology No results found.  Procedures Procedures (including critical care time)  CRITICAL CARE Performed by: Total critical care time: 40 minutes Critical care time was exclusive of separately billable procedures and treating other patients. Critical care was necessary to treat or prevent imminent or life-threatening deterioration. Critical care was time spent personally by me on the following activities: development of treatment plan with patient and/or surrogate as well as nursing, discussions with consultants, evaluation of patient's response to treatment, examination of patient, obtaining history from patient or surrogate, ordering and performing treatments and interventions, ordering and review of laboratory studies, ordering and review of radiographic studies, pulse oximetry and re-evaluation of patient's condition.   Medications Ordered in ED Medications  0.9 %  sodium chloride infusion (has no administration in time range)  metoprolol tartrate (LOPRESSOR) tablet 25 mg (has no administration in time range)    adenosine (ADENOCARD) 6 MG/2ML injection 12 mg ( Intravenous Given 01/28/20 1816)  sodium chloride 0.9 % bolus 500 mL (500 mLs Intravenous New Bag/Given 01/28/20 1815)  adenosine (ADENOCARD) 6 MG/2ML injection 12 mg (12 mg Intravenous Given 01/28/20 1818)    ED Course  I have reviewed the triage vital signs and the nursing notes.  Pertinent labs & imaging results that were available during my care of the patient were reviewed by me and considered in my medical decision making (see chart for details).    MDM Rules/Calculators/A&P                          Patient heart rate was around 225.  Patient awake and alert.  Blood pressure reading 87 systolic.  But I doubt I can get a  good read with a heart rate being as fast as it is.  Patient received 6 mg of adenosine without any pause.  Placed on 2 L of oxygen.  Although oxygen saturation 100% on room air.  Patient given 12 mg of adenosine and he did convert.  Heart rate back to sinus.  Little bit of sinus arrhythmia first but then into sinus on the monitor.  Blood pressure improved significantly and is currently 112/84 patient feels fine.  Oxygen sats 100%.   We will get some basic labs.  Chest x-ray and observe.   Patient's labs and chest x-ray without any significant abnormalities.  Creatinine elevated a little bit at 1.33.  Chest x-ray clear no signs of pulmonary edema.  Final Clinical Impression(s) / ED Diagnoses Final diagnoses:  SVT (supraventricular tachycardia) [facility])    Rx / DC Orders ED Discharge Orders    None       Vanetta Mulders, MD 01/28/20 Jolene Provost, MD 01/28/20 2005

## 2020-01-28 NOTE — Discharge Instructions (Addendum)
Take the metoprolol as directed.  Contact cardiology for follow-up.  Return for any new or worse symptoms.

## 2020-01-28 NOTE — ED Triage Notes (Signed)
Pt reports he was on his way to work and began having center chest pain, back pain and feeling tachycardic; triage HR 167

## 2020-01-29 ENCOUNTER — Telehealth: Payer: Self-pay | Admitting: Orthopaedic Surgery

## 2020-01-29 NOTE — Telephone Encounter (Signed)
Advil or Aleve.  He can use Aspercreme, Biofreeze or Voltaren gel also.

## 2020-01-29 NOTE — Telephone Encounter (Signed)
Pt was advised.

## 2020-01-29 NOTE — Telephone Encounter (Signed)
It has been relayed to Alan Curtis that you wont give any more narcotics.    He asks what he should do or what he can take?  Please advise as to what to tell him  Thanks

## 2020-02-12 ENCOUNTER — Telehealth: Payer: Self-pay | Admitting: Orthopaedic Surgery

## 2020-02-12 NOTE — Telephone Encounter (Signed)
A young man his age should not be on long term narcotics.  I understand he is sore at the end of a shift, but he should not need narcotics for that.  No narcotics.

## 2020-02-12 NOTE — Telephone Encounter (Signed)
Alan Curtis called this morning asking that you reconsider giving him pain medication.  He said he has tried everything that was suggested to him, Advil Aleve, and all the creams but nothing really helps.  He said when he gets off work, his whole body hurts.    He states he still uses [facility]

## 2020-02-18 ENCOUNTER — Emergency Department (HOSPITAL_COMMUNITY)
Admission: EM | Admit: 2020-02-18 | Discharge: 2020-02-18 | Disposition: A | Discharge status: Home / Self Care | Payer: Self-pay | Attending: Emergency Medicine | Admitting: Emergency Medicine

## 2020-02-18 ENCOUNTER — Other Ambulatory Visit: Payer: Self-pay

## 2020-02-18 ENCOUNTER — Encounter (HOSPITAL_COMMUNITY): Payer: Self-pay | Admitting: *Deleted

## 2020-02-18 DIAGNOSIS — K0889 Other specified disorders of teeth and supporting structures: Secondary | ICD-10-CM | POA: Insufficient documentation

## 2020-02-18 DIAGNOSIS — F1590 Other stimulant use, unspecified, uncomplicated: Secondary | ICD-10-CM | POA: Insufficient documentation

## 2020-02-18 DIAGNOSIS — Z7982 Long term (current) use of aspirin: Secondary | ICD-10-CM | POA: Insufficient documentation

## 2020-02-18 DIAGNOSIS — Z79899 Other long term (current) drug therapy: Secondary | ICD-10-CM | POA: Insufficient documentation

## 2020-02-18 DIAGNOSIS — F17210 Nicotine dependence, cigarettes, uncomplicated: Secondary | ICD-10-CM | POA: Insufficient documentation

## 2020-02-18 MED ORDER — OXYCODONE-ACETAMINOPHEN 5-325 MG PO TABS
1.0000 | ORAL_TABLET | Freq: Once | ORAL | Status: AC
  Administered 2020-02-18: 1 via ORAL
  Filled 2020-02-18: qty 1

## 2020-02-18 MED ORDER — PENICILLIN V POTASSIUM 500 MG PO TABS: 500.0000 mg | ORAL_TABLET | Freq: Four times a day (QID) | ORAL | 0 refills | Status: AC

## 2020-02-18 MED ORDER — NAPROXEN 500 MG PO TABS: 500.0000 mg | ORAL_TABLET | Freq: Two times a day (BID) | ORAL | 0 refills | Status: AC | PRN

## 2020-02-18 NOTE — ED Triage Notes (Signed)
Left jaw pain for 2 days, states it is dental or sinus problems

## 2020-02-18 NOTE — Discharge Instructions (Signed)
Recommend Tylenol and anti-inflammatory such as the prescribed naproxen or Motrin for pain control.  Please take antibiotic as prescribed.  Tomorrow morning, please call a local dentist to get follow-up appointment, ideally to be seen within the next couple days.  If you feel your pain is significantly worsened, swelling has worsened, you develop fever or purulent drainage, please return to ER for reassessment.

## 2020-02-18 NOTE — ED Provider Notes (Signed)
[facility] EMERGENCY DEPARTMENT Provider Note   CSN: [id number] Arrival date & time: 02/18/20  1658     History Chief Complaint  Patient presents with  . Jaw Pain  . Dental Pain    Alan Curtis is a 34 y.o. male.  Presents to ER with concern for dental pain.  Patient over the past 2 days has noted some pain on his left jaw, left lower teeth.  Reports he has had similar symptoms in the past, 5 years ago had wisdom teeth pulled.  Pain is moderate to severe, sharp, stabbing, nonradiating.  He has not noticed any swelling but someone at his work told him he may have had some swelling on his left jaw.  No redness, no drainage, no fever.    HPI     Past Medical History:  Diagnosis Date  . Chest pain 09/09/2015  . Elevated troponin 09/09/2015  . KNEE PAIN 12/02/2008   Qualifier: Diagnosis of  By: Romeo Apple MD, Duffy Rhody    . NSTEMI (non-ST elevated myocardial infarction) (HCC) 09/09/2015  . Pain in the chest   . Palpitations 09/09/2015  . SVT (supraventricular tachycardia) (HCC)   . Tobacco abuse 09/09/2015    Patient Active Problem List   Diagnosis Date Noted  . Pain in the chest   . NSTEMI (non-ST elevated myocardial infarction) (HCC) 09/09/2015  . Chest pain 09/09/2015  . Elevated troponin 09/09/2015  . Palpitations 09/09/2015  . Tobacco abuse 09/09/2015  . KNEE PAIN 12/02/2008    Past Surgical History:  Procedure Laterality Date  . KNEE SURGERY         Family History  Problem Relation Age of Onset  . Hypertension Mother   . Hypertension Father   . Asthma Brother   . Hypertension Maternal Grandmother   . Diabetes Maternal Grandmother     Social History   Tobacco Use  . Smoking status: Current Every Day Smoker    Packs/day: 0.50    Types: Cigarettes  . Smokeless tobacco: Never Used  Vaping Use  . Vaping Use: Never used  Substance Use Topics  . Alcohol use: No    Alcohol/week: 0.0 standard drinks  . Drug use: Yes    Types: Marijuana    Comment: Sunday      Home Medications Prior to Admission medications   Medication Sig Start Date End Date Taking? Authorizing Provider  aspirin EC 81 MG tablet Take 81 mg by mouth in the morning. Swallow whole.    [provider]  HYDROcodone-acetaminophen (NORCO/VICODIN) 5-325 MG tablet One tablet every six hours for pain.  Limit 7 days. Patient taking differently: Take 1 tablet by mouth See admin instructions. Takes one tablet in the morning. *May take one additional tablet in the evening  after work as needed for pain 01/15/20   Darreld Mclean, MD  metoprolol tartrate (LOPRESSOR) 25 MG tablet Take 1 tablet (25 mg total) by mouth 2 (two) times daily. 01/28/20   Vanetta Mulders, MD  naproxen (NAPROSYN) 500 MG tablet Take 1 tablet (500 mg total) by mouth 2 (two) times daily as needed for moderate pain. 02/18/20   Milagros Loll, MD  penicillin v potassium (VEETID) 500 MG tablet Take 1 tablet (500 mg total) by mouth 4 (four) times daily for 7 days. 02/18/20 02/25/20  Milagros Loll, MD    Allergies    Patient has no known allergies.  Review of Systems   Review of Systems  Constitutional: Negative for chills and fever.  HENT:  Positive for dental problem. Negative for ear pain and sore throat.   Eyes: Negative for pain and visual disturbance.  Respiratory: Negative for cough and shortness of breath.   Cardiovascular: Negative for chest pain and palpitations.  Gastrointestinal: Negative for abdominal pain and vomiting.  Genitourinary: Negative for dysuria and hematuria.  Musculoskeletal: Negative for arthralgias and back pain.  Skin: Negative for color change and rash.  Neurological: Negative for seizures and syncope.  All other systems reviewed and are negative.   Physical Exam Updated Vital Signs BP (!) 139/97 (BP Location: Right Arm)   Pulse 78   Temp 98.4 F (36.9 C) (Oral)   Resp 16   Ht 5\' 8"  (1.727 m)   Wt 63.5 kg   SpO2 100%   BMI 21.29 kg/m   Physical Exam Vitals and  nursing note reviewed.  Constitutional:      Appearance: Normal appearance.  HENT:     Head: Normocephalic and atraumatic.     Nose: Nose normal.     Mouth/Throat:     Mouth: Mucous membranes are moist.     Pharynx: Oropharynx is clear.     Comments: Extensive dental caries, there is some tenderness over left lower molars, no induration, swelling, purulence  There is no generalized swelling or erythema over her face Eyes:     Extraocular Movements: Extraocular movements intact.     Pupils: Pupils are equal, round, and reactive to light.  Cardiovascular:     Rate and Rhythm: Normal rate.     Pulses: Normal pulses.  Pulmonary:     Effort: Pulmonary effort is normal. No respiratory distress.  Musculoskeletal:        General: No deformity or signs of injury.  Skin:    General: Skin is warm and dry.     Capillary Refill: Capillary refill takes less than 2 seconds.  Neurological:     General: No focal deficit present.     Mental Status: He is alert.  Psychiatric:        Mood and Affect: Mood normal.        Behavior: Behavior normal.     ED Results / Procedures / Treatments   Labs (all labs ordered are listed, but only abnormal results are displayed) Labs Reviewed - No data to display  EKG None  Radiology No results found.  Procedures Procedures (including critical care time)  Medications Ordered in ED Medications  oxyCODONE-acetaminophen (PERCOCET/ROXICET) 5-325 MG per tablet 1 tablet (has no administration in time range)    ED Course  I have reviewed the triage vital signs and the nursing notes.  Pertinent labs & imaging results that were available during my care of the patient were reviewed by me and considered in my medical decision making (see chart for details).    MDM Rules/Calculators/A&P                          34 year old male presents to ER with left lower dental pain.  Physical exam notable for extensive dental caries, but no appreciable abscess,  swelling noted, there is no generalized swelling or erythema over the face.  Afebrile and otherwise well-appearing.  Provide patient Rx for penicillin, recommended close follow-up with dentist.  Reviewed return precautions and discharged home.    After the discussed management above, the patient was determined to be safe for discharge.  The patient was in agreement with this plan and all questions regarding their care were answered.  ED return precautions were discussed and the patient will return to the ED with any significant worsening of condition.   Final Clinical Impression(s) / ED Diagnoses Final diagnoses:  Pain, dental    Rx / DC Orders ED Discharge Orders         Ordered    naproxen (NAPROSYN) 500 MG tablet  2 times daily PRN     Discontinue  Reprint     02/18/20 1924    penicillin v potassium (VEETID) 500 MG tablet  4 times daily     Discontinue  Reprint     02/18/20 1924           Milagros Loll, MD 02/18/20 1929

## 2020-02-18 NOTE — ED Triage Notes (Signed)
Request work note x 2

## 2020-04-10 ENCOUNTER — Ambulatory Visit: Payer: Self-pay | Admitting: Orthopaedic Surgery

## 2020-04-11 ENCOUNTER — Other Ambulatory Visit: Payer: Self-pay

## 2020-04-11 DIAGNOSIS — F1590 Other stimulant use, unspecified, uncomplicated: Secondary | ICD-10-CM | POA: Insufficient documentation

## 2020-04-11 DIAGNOSIS — Z79899 Other long term (current) drug therapy: Secondary | ICD-10-CM | POA: Insufficient documentation

## 2020-04-11 DIAGNOSIS — N289 Disorder of kidney and ureter, unspecified: Secondary | ICD-10-CM | POA: Insufficient documentation

## 2020-04-11 DIAGNOSIS — F17210 Nicotine dependence, cigarettes, uncomplicated: Secondary | ICD-10-CM | POA: Insufficient documentation

## 2020-04-11 DIAGNOSIS — I471 Supraventricular tachycardia: Secondary | ICD-10-CM | POA: Insufficient documentation

## 2020-04-12 ENCOUNTER — Emergency Department (HOSPITAL_COMMUNITY)
Admission: EM | Admit: 2020-04-12 | Discharge: 2020-04-12 | Disposition: A | Discharge status: Home / Self Care | Payer: Self-pay | Attending: Emergency Medicine | Admitting: Emergency Medicine

## 2020-04-12 ENCOUNTER — Encounter (HOSPITAL_COMMUNITY): Payer: Self-pay | Admitting: Emergency Medicine

## 2020-04-12 ENCOUNTER — Other Ambulatory Visit: Payer: Self-pay

## 2020-04-12 DIAGNOSIS — N289 Disorder of kidney and ureter, unspecified: Secondary | ICD-10-CM

## 2020-04-12 DIAGNOSIS — E860 Dehydration: Secondary | ICD-10-CM

## 2020-04-12 DIAGNOSIS — I471 Supraventricular tachycardia: Secondary | ICD-10-CM

## 2020-04-12 LAB — BASIC METABOLIC PANEL
Anion gap: 12 (ref 5–15)
BUN: 13 mg/dL (ref 6–20)
CO2: 25 mmol/L (ref 22–32)
Calcium: 9.1 mg/dL (ref 8.9–10.3)
Chloride: 100 mmol/L (ref 98–111)
Creatinine, Ser: 1.75 mg/dL — ABNORMAL HIGH (ref 0.61–1.24)
GFR calc Af Amer: 59 mL/min — ABNORMAL LOW (ref >60)
GFR calc non Af Amer: 51 mL/min — ABNORMAL LOW (ref >60)
Glucose, Bld: 132 mg/dL — ABNORMAL HIGH (ref 70–99)
Potassium: 4 mmol/L (ref 3.5–5.1)
Sodium: 137 mmol/L (ref 135–145)

## 2020-04-12 LAB — CBC WITH DIFFERENTIAL/PLATELET
Abs Immature Granulocytes: 0.1 10*3/uL — ABNORMAL HIGH (ref 0.00–0.07)
Basophils Absolute: 0.1 10*3/uL (ref 0.0–0.1)
Basophils Relative: 0 %
Eosinophils Absolute: 0.5 10*3/uL (ref 0.0–0.5)
Eosinophils Relative: 3 %
HCT: 47.6 % (ref 39.0–52.0)
Hemoglobin: 15.5 g/dL (ref 13.0–17.0)
Immature Granulocytes: 1 %
Lymphocytes Relative: 18 %
Lymphs Abs: 3.3 10*3/uL (ref 0.7–4.0)
MCH: 30.6 pg (ref 26.0–34.0)
MCHC: 32.6 g/dL (ref 30.0–36.0)
MCV: 93.9 fL (ref 80.0–100.0)
Monocytes Absolute: 1 10*3/uL (ref 0.1–1.0)
Monocytes Relative: 6 %
Neutro Abs: 13.2 10*3/uL — ABNORMAL HIGH (ref 1.7–7.7)
Neutrophils Relative %: 72 %
Platelets: 244 10*3/uL (ref 150–400)
RBC: 5.07 MIL/uL (ref 4.22–5.81)
RDW: 15 % (ref 11.5–15.5)
WBC: 18.1 10*3/uL — ABNORMAL HIGH (ref 4.0–10.5)
nRBC: 0 % (ref 0.0–0.2)

## 2020-04-12 MED ORDER — ADENOSINE 6 MG/2ML IV SOLN: 12.0000 mg | Freq: Once | INTRAVENOUS | Status: AC

## 2020-04-12 MED ORDER — ADENOSINE 6 MG/2ML IV SOLN
INTRAVENOUS | Status: AC
  Administered 2020-04-12: 6 mg via INTRAVENOUS
  Filled 2020-04-12: qty 2

## 2020-04-12 MED ORDER — LACTATED RINGERS IV BOLUS
1000.0000 mL | Freq: Once | INTRAVENOUS | Status: AC
  Administered 2020-04-12: 1000 mL via INTRAVENOUS

## 2020-04-12 MED ORDER — ADENOSINE 6 MG/2ML IV SOLN: 6.0000 mg | Freq: Once | INTRAVENOUS | Status: AC

## 2020-04-12 MED ORDER — ADENOSINE 6 MG/2ML IV SOLN
INTRAVENOUS | Status: AC
  Administered 2020-04-12: 12 mg via INTRAVENOUS
  Filled 2020-04-12: qty 2

## 2020-04-12 NOTE — Discharge Instructions (Addendum)
Your kidneys are not working as well as they should be. Please make sure to drink plenty of fluids. Please see your primary care provider to recheck your creatinine  (blood test of your kidney) in 1-2 weeks.  Talk with the cardiologist regarding what you should do about your heart rhythm problem.

## 2020-04-12 NOTE — ED Triage Notes (Signed)
Pt c/o felling his heart racing since about 10pm tonight. Hx of SVT.

## 2020-04-12 NOTE — ED Provider Notes (Signed)
[facility] EMERGENCY DEPARTMENT Provider Note   CSN: [id number] Arrival date & time: 04/11/20  2342   History Chief Complaint  Patient presents with  . Palpitations    Alan Curtis is a 34 y.o. male.  The history is provided by the patient.  Palpitations He has history of PSVT and states he woke up at about 10 PM with a sense of his heart racing.  He tried vagal maneuvers without any benefit.  He denies chest pain, heaviness, tightness, pressure.  He denies dyspnea or nausea.  He is feeling weak and slightly dizzy.  In the past, he had cardiac ablation recommended and he decided that he did not want to go through with that.  Past Medical History:  Diagnosis Date  . Chest pain 09/09/2015  . Elevated troponin 09/09/2015  . KNEE PAIN 12/02/2008   Qualifier: Diagnosis of  By: Romeo Apple MD, Duffy Rhody    . NSTEMI (non-ST elevated myocardial infarction) (HCC) 09/09/2015  . Pain in the chest   . Palpitations 09/09/2015  . SVT (supraventricular tachycardia) (HCC)   . Tobacco abuse 09/09/2015    Patient Active Problem List   Diagnosis Date Noted  . Pain in the chest   . NSTEMI (non-ST elevated myocardial infarction) (HCC) 09/09/2015  . Chest pain 09/09/2015  . Elevated troponin 09/09/2015  . Palpitations 09/09/2015  . Tobacco abuse 09/09/2015  . KNEE PAIN 12/02/2008    Past Surgical History:  Procedure Laterality Date  . KNEE SURGERY         Family History  Problem Relation Age of Onset  . Hypertension Mother   . Hypertension Father   . Asthma Brother   . Hypertension Maternal Grandmother   . Diabetes Maternal Grandmother     Social History   Tobacco Use  . Smoking status: Current Every Day Smoker    Packs/day: 0.50    Types: Cigarettes  . Smokeless tobacco: Never Used  Vaping Use  . Vaping Use: Never used  Substance Use Topics  . Alcohol use: No    Alcohol/week: 0.0 standard drinks  . Drug use: Yes    Types: Marijuana    Comment: Sunday    Home  Medications Prior to Admission medications   Medication Sig Start Date End Date Taking? Authorizing Provider  aspirin EC 81 MG tablet Take 81 mg by mouth in the morning. Swallow whole.    [provider]  HYDROcodone-acetaminophen (NORCO/VICODIN) 5-325 MG tablet One tablet every six hours for pain.  Limit 7 days. Patient taking differently: Take 1 tablet by mouth See admin instructions. Takes one tablet in the morning. *May take one additional tablet in the evening  after work as needed for pain 01/15/20   Darreld Mclean, MD  metoprolol tartrate (LOPRESSOR) 25 MG tablet Take 1 tablet (25 mg total) by mouth 2 (two) times daily. 01/28/20   Vanetta Mulders, MD  naproxen (NAPROSYN) 500 MG tablet Take 1 tablet (500 mg total) by mouth 2 (two) times daily as needed for moderate pain. 02/18/20   Milagros Loll, MD    Allergies    Patient has no known allergies.  Review of Systems   Review of Systems  Cardiovascular: Positive for palpitations.  All other systems reviewed and are negative.   Physical Exam Updated Vital Signs BP (!) 88/50   Pulse (!) 117   Temp 98.2 F (36.8 C) (Oral)   Resp (!) 22   Ht 5\' 8"  (1.727 m)   Wt 63.5 kg  SpO2 98%   BMI 21.29 kg/m   Physical Exam Vitals and nursing note reviewed.   34 year old male, resting comfortably and in no acute distress. Vital signs are significant for low blood pressure, rapid heart rate, rapid respiratory rate. Oxygen saturation is 98%, which is normal. Head is normocephalic and atraumatic. PERRLA, EOMI. Oropharynx is clear. Neck is nontender and supple without adenopathy or JVD. Back is nontender and there is no CVA tenderness. Lungs are clear without rales, wheezes, or rhonchi. Chest is nontender. Heart is tachycardic without murmur. Abdomen is soft, flat, nontender without masses or hepatosplenomegaly and peristalsis is normoactive. Extremities have no cyanosis or edema, full range of motion is present. Skin is warm  and dry without rash. Neurologic: Mental status is normal, cranial nerves are intact, there are no motor or sensory deficits.  ED Results / Procedures / Treatments   Labs (all labs ordered are listed, but only abnormal results are displayed) Labs Reviewed  BASIC METABOLIC PANEL - Abnormal; Notable for the following components:      Result Value   Glucose, Bld 132 (*)    Creatinine, Ser 1.75 (*)    GFR calc non Af Amer 51 (*)    GFR calc Af Amer 59 (*)    All other components within normal limits  CBC WITH DIFFERENTIAL/PLATELET - Abnormal; Notable for the following components:   WBC 18.1 (*)    Neutro Abs 13.2 (*)    Abs Immature Granulocytes 0.10 (*)    All other components within normal limits    EKG EKG Interpretation  Date/Time:  Saturday April 12 2020 00:17:09 EDT Ventricular Rate:  231 PR Interval:    QRS Duration: 117 QT Interval:  342 QTC Calculation: 671 R Axis:   -90 Text Interpretation: Supraventricular tachycardia Right bundle branch block LVH by voltage Inferior infarct, old Lateral leads are also involved Baseline wander in lead(s) I III aVL Marked ST abnormality - probably rate-related When compared with ECG of 01/28/2020, Supraventricular tachycardia has replaced Sinus rhythm ST abnormality is significantly more prominent - probably rate-related Confirmed by Dione Booze ([id number]) on 04/12/2020 1:26:34 AM   Procedures .Cardioversion  Date/Time: 04/12/2020 12:24 AM Performed by: Dione Booze, MD Authorized by: Dione Booze, MD   Consent:    Consent obtained:  Verbal   Consent given by:  Patient   Risks discussed:  Induced arrhythmia and death   Alternatives discussed:  Alternative treatment Pre-procedure details:    Cardioversion basis:  Emergent   Rhythm:  Supraventricular tachycardia Patient sedated: No Attempt one:    Cardioversion mode attempt one: Adenosine 6 mg.   Shock outcome:  No change in rhythm Attempt two:    Cardioversion mode attempt  two: Adenosine 12 mg.   Shock outcome:  Conversion to normal sinus rhythm Post-procedure details:    Patient status:  Awake   Patient tolerance of procedure:  Tolerated well, no immediate complications    CRITICAL CARE Performed by: Dione Booze Total critical care time: 40 minutes Critical care time was exclusive of separately billable procedures and treating other patients. Critical care was necessary to treat or prevent imminent or life-threatening deterioration. Critical care was time spent personally by me on the following activities: development of treatment plan with patient and/or surrogate as well as nursing, discussions with consultants, evaluation of patient's response to treatment, examination of patient, obtaining history from patient or surrogate, ordering and performing treatments and interventions, ordering and review of laboratory studies, ordering and review of radiographic  studies, pulse oximetry and re-evaluation of patient's condition.  Medications Ordered in ED Medications  adenosine (ADENOCARD) 6 MG/2ML injection 6 mg (6 mg Intravenous Given 04/12/20 0016)  adenosine (ADENOCARD) 6 MG/2ML injection 12 mg (12 mg Intravenous Given 04/12/20 0018)    ED Course  I have reviewed the triage vital signs and the nursing notes.  Pertinent lab results that were available during my care of the patient were reviewed by me and considered in my medical decision making (see chart for details).    MDM Rules/Calculators/A&P Paroxysmal supraventricular tachycardia.  Patient was brought back immediately and given 6 mg of adenosine without any change in rhythm.  He is given 12 mg of adenosine and converted to sinus rhythm.  Old records are reviewed confirming prior ED visits for PSVT, cardiology evaluation recommending ablation which patient declined.  Given how fast his heart rate is, I feel he should be reevaluated by cardiology and reconsider ablation.  Will check electrolytes and observe  in the ED.  Labs show worsening renal function compared with baseline.  Also, blood pressure is staying low in spite of conversion to sinus rhythm.  He will be given aggressive IV hydration and recheck.  Following 2 L of fluid, blood pressure is adequate and patient is felt to be safe for discharge.  He is advised to have creatinine rechecked in 1-2 weeks, referred back to his cardiologist regarding management of his SVT.  Final Clinical Impression(s) / ED Diagnoses Final diagnoses:  SVT (supraventricular tachycardia) (HCC)  Renal insufficiency    Rx / DC Orders ED Discharge Orders    None       Dione Booze, MD 04/12/20 [phone]

## 2022-06-30 IMAGING — DX DG CHEST 1V PORT
1 series · 1 of 1 positions shown · non-contrast
Comparison: Prior chest radiographs 05/10/2018 and earlier

CLINICAL DATA: SVT.

EXAM:
PORTABLE CHEST 1 VIEW

[chest ap]
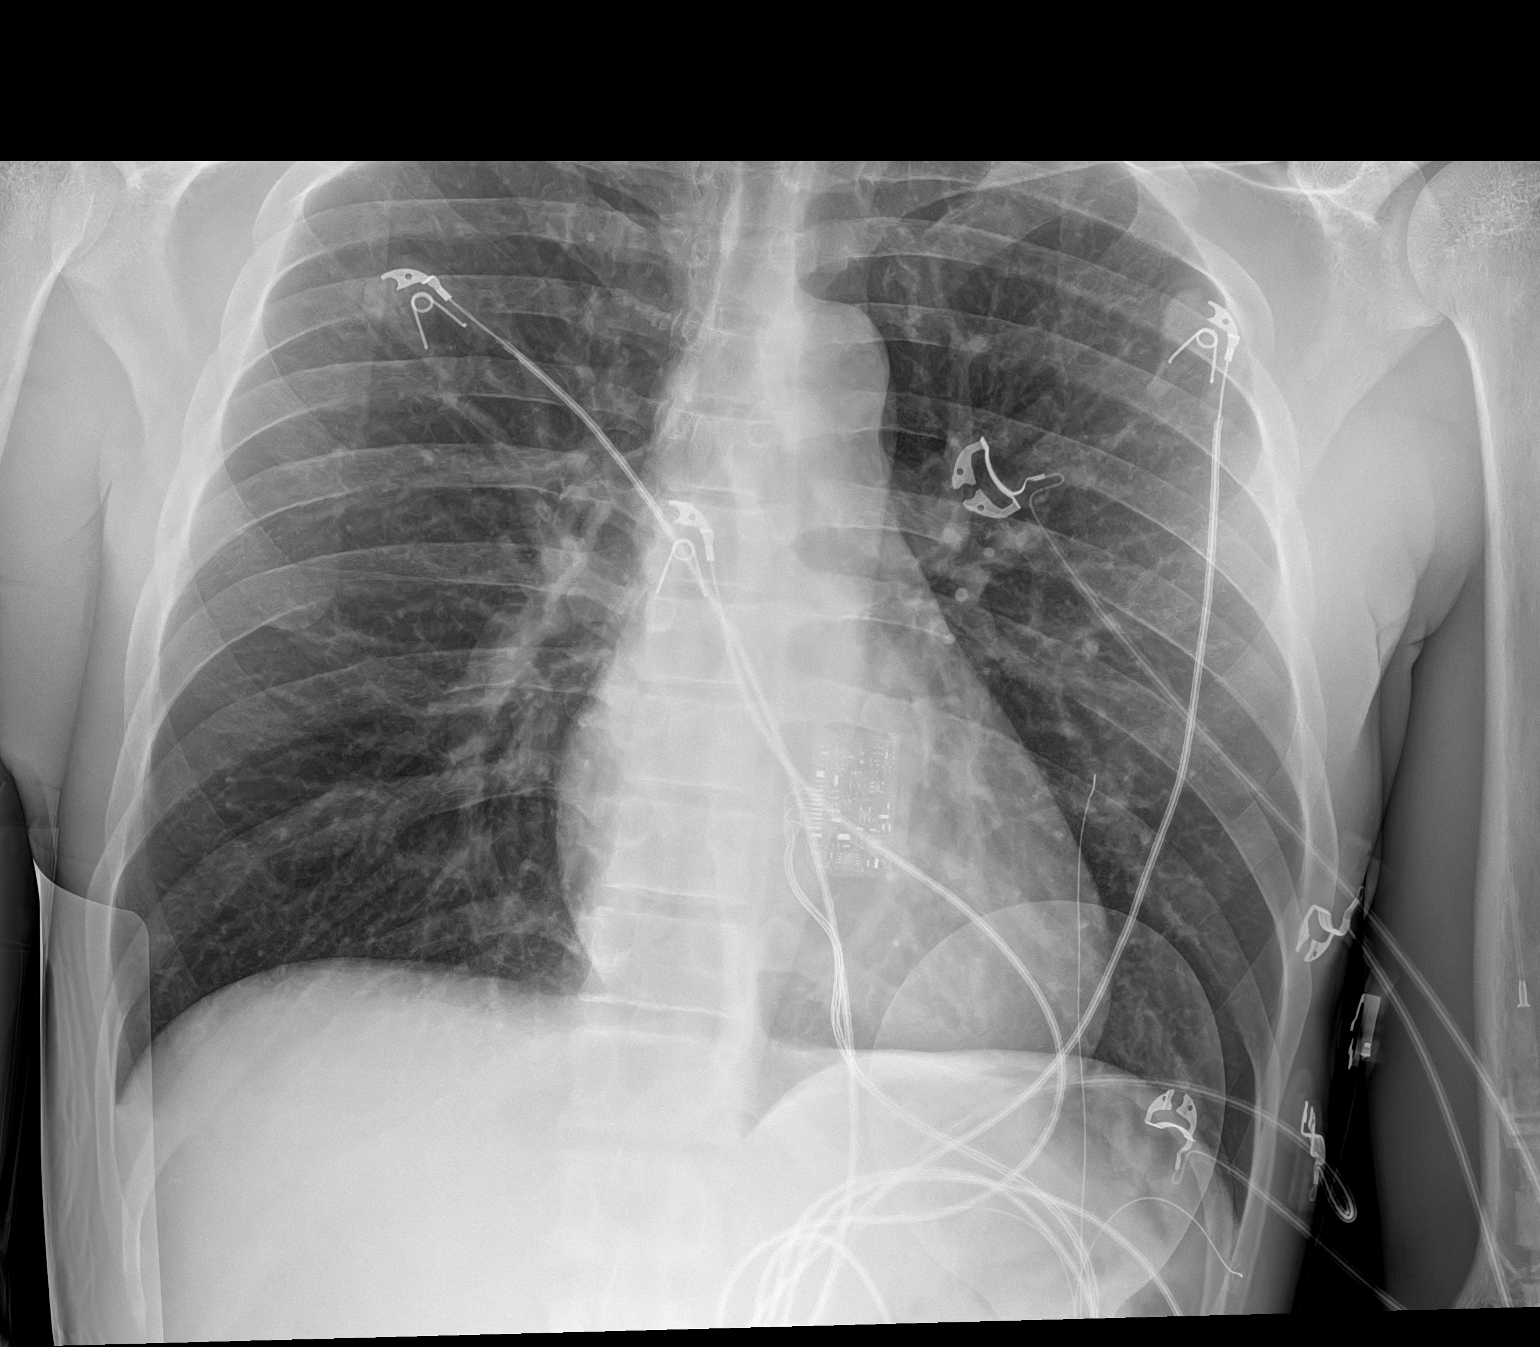

[1 of 1 positions shown; findings below may reference images not displayed]

FINDINGS: Cardiac monitoring pads overlie the left thorax

heart size within normal limits.

There is no appreciable airspace consolidation. No frank pulmonary
edema.

No evidence of pleural effusion or pneumothorax.

No acute bony abnormality identified.
IMPRESSION: No evidence of active cardiopulmonary disease.

## 2023-02-28 ENCOUNTER — Other Ambulatory Visit: Payer: Self-pay

## 2023-02-28 ENCOUNTER — Emergency Department (HOSPITAL_COMMUNITY)
Admission: EM | Admit: 2023-02-28 | Discharge: 2023-02-28 | Disposition: A | Discharge status: Home / Self Care | Payer: Self-pay | Attending: Emergency Medicine | Admitting: Emergency Medicine

## 2023-02-28 ENCOUNTER — Encounter (HOSPITAL_COMMUNITY): Payer: Self-pay

## 2023-02-28 DIAGNOSIS — Z79899 Other long term (current) drug therapy: Secondary | ICD-10-CM | POA: Insufficient documentation

## 2023-02-28 DIAGNOSIS — Z7982 Long term (current) use of aspirin: Secondary | ICD-10-CM | POA: Insufficient documentation

## 2023-02-28 DIAGNOSIS — R112 Nausea with vomiting, unspecified: Secondary | ICD-10-CM | POA: Insufficient documentation

## 2023-02-28 LAB — CBC
HCT: 46.6 % (ref 39.0–52.0)
Hemoglobin: 15.4 g/dL (ref 13.0–17.0)
MCH: 30.2 pg (ref 26.0–34.0)
MCHC: 33 g/dL (ref 30.0–36.0)
MCV: 91.4 fL (ref 80.0–100.0)
Platelets: 288 10*3/uL (ref 150–400)
RBC: 5.1 MIL/uL (ref 4.22–5.81)
RDW: 15 % (ref 11.5–15.5)
WBC: 9.8 10*3/uL (ref 4.0–10.5)
nRBC: 0 % (ref 0.0–0.2)

## 2023-02-28 LAB — COMPREHENSIVE METABOLIC PANEL
ALT: 12 U/L (ref 0–44)
AST: 15 U/L (ref 15–41)
Albumin: 4.1 g/dL (ref 3.5–5.0)
Alkaline Phosphatase: 90 U/L (ref 38–126)
Anion gap: 11 (ref 5–15)
BUN: 10 mg/dL (ref 6–20)
CO2: 23 mmol/L (ref 22–32)
Calcium: 9 mg/dL (ref 8.9–10.3)
Chloride: 103 mmol/L (ref 98–111)
Creatinine, Ser: 1.12 mg/dL (ref 0.61–1.24)
GFR, Estimated: >60 mL/min (ref >60)
Glucose, Bld: 103 mg/dL — ABNORMAL HIGH (ref 70–99)
Potassium: 3.4 mmol/L — ABNORMAL LOW (ref 3.5–5.1)
Sodium: 137 mmol/L (ref 135–145)
Total Bilirubin: 0.9 mg/dL (ref 0.3–1.2)
Total Protein: 7.9 g/dL (ref 6.5–8.1)

## 2023-02-28 LAB — LIPASE, BLOOD: Lipase: 21 U/L (ref 11–51)

## 2023-02-28 MED ORDER — ONDANSETRON 8 MG PO TBDP
8.0000 mg | ORAL_TABLET | Freq: Once | ORAL | Status: AC
  Administered 2023-02-28: 8 mg via ORAL
  Filled 2023-02-28: qty 1

## 2023-02-28 MED ORDER — ONDANSETRON HCL 4 MG PO TABS: 4.0000 mg | ORAL_TABLET | Freq: Four times a day (QID) | ORAL | 0 refills | Status: AC

## 2023-02-28 NOTE — ED Triage Notes (Signed)
Pt arrives ambulatory to Triage (missed being called a few times related to patient being in parking lot) Pt c/o vomiting since this morning, 2 episodes, denies any diarrhea.

## 2023-02-28 NOTE — Discharge Instructions (Signed)
Avoid sodas, drink water and/or Gatorade.  Avoid spicy greasy foods.  Bland diet as tolerated.  Try to limit or stop marijuana use.  Follow-up with your primary care provider for recheck return to emergency department if needed.

## 2023-02-28 NOTE — ED Notes (Signed)
Pt states he is feeling better at this time. Also states he will need to leave by 630 pm to pick up family member from work. PA made aware.

## 2023-02-28 NOTE — ED Provider Notes (Signed)
[place] EMERGENCY DEPARTMENT AT [facility] Provider Note   CSN: [id number] Arrival date & time: 02/28/23  1455     History  Chief Complaint  Patient presents with   Emesis    Alan Curtis is a 37 y.o. male.   Emesis Associated symptoms: no abdominal pain, no chills, no diarrhea, no fever and no headaches         Alan Curtis is a 36 y.o. male past medical history of SVT and NSTEMI who presents to the Emergency Department complaining of nausea vomiting and possible dehydration.  He states that he woke this morning with persistent vomiting.  No abdominal pain.  No diarrhea.  He attempted to go to work and was working out in the heat sweating and has not been drinking sufficient amount of water.  Believes he may have been dehydrated.  He denies any symptoms at present.  Does admit to daily marijuana use.  Denies any abdominal pain fever or chills.  No dysuria.   Home Medications Prior to Admission medications   Medication Sig Start Date End Date Taking? Authorizing Provider  aspirin EC 81 MG tablet Take 81 mg by mouth in the morning. Swallow whole.    [provider]  HYDROcodone-acetaminophen (NORCO/VICODIN) 5-325 MG tablet One tablet every six hours for pain.  Limit 7 days. Patient taking differently: Take 1 tablet by mouth See admin instructions. Takes one tablet in the morning. *May take one additional tablet in the evening  after work as needed for pain 01/15/20   Darreld Mclean, MD  metoprolol tartrate (LOPRESSOR) 25 MG tablet Take 1 tablet (25 mg total) by mouth 2 (two) times daily. 01/28/20   Vanetta Mulders, MD  naproxen (NAPROSYN) 500 MG tablet Take 1 tablet (500 mg total) by mouth 2 (two) times daily as needed for moderate pain. 02/18/20   Milagros Loll, MD      Allergies    Patient has no known allergies.    Review of Systems   Review of Systems  Constitutional:  Negative for chills, diaphoresis and fever.  HENT:  Negative for  trouble swallowing.   Respiratory:  Negative for shortness of breath.   Cardiovascular:  Negative for chest pain.  Gastrointestinal:  Positive for nausea and vomiting. Negative for abdominal pain and diarrhea.  Genitourinary:  Negative for dysuria and flank pain.  Musculoskeletal:  Negative for back pain, neck pain and neck stiffness.  Skin:  Negative for rash and wound.  Neurological:  Negative for dizziness, syncope, weakness, numbness and headaches.    Physical Exam Updated Vital Signs BP (!) 133/96 (BP Location: Right Arm)   Pulse (!) 108   Temp 98.7 F (37.1 C) (Oral)   Resp 16   Ht 5\' 8"  (1.727 m)   Wt 68 kg   SpO2 97%   BMI 22.81 kg/m  Physical Exam Vitals and nursing note reviewed.  Constitutional:      General: He is not in acute distress.    Appearance: Normal appearance. He is not ill-appearing or toxic-appearing.  HENT:     Mouth/Throat:     Mouth: Mucous membranes are moist.     Pharynx: Oropharynx is clear.  Cardiovascular:     Rate and Rhythm: Normal rate and regular rhythm.     Pulses: Normal pulses.  Pulmonary:     Effort: Pulmonary effort is normal. No respiratory distress.  Abdominal:     General: There is no distension.     Palpations:  Abdomen is soft.     Tenderness: There is no abdominal tenderness.  Musculoskeletal:        General: Normal range of motion.  Skin:    General: Skin is warm.     Capillary Refill: Capillary refill takes less than 2 seconds.     Findings: No erythema or rash.  Neurological:     General: No focal deficit present.     Mental Status: He is alert.     Sensory: No sensory deficit.     Motor: No weakness.     ED Results / Procedures / Treatments   Labs (all labs ordered are listed, but only abnormal results are displayed) Labs Reviewed  CBC  LIPASE, BLOOD  COMPREHENSIVE METABOLIC PANEL  URINALYSIS, ROUTINE W REFLEX MICROSCOPIC    EKG EKG Interpretation Date/Time:  Monday February 28 2023 16:08:40  EDT Ventricular Rate:  104 PR Interval:  114 QRS Duration:  86 QT Interval:  350 QTC Calculation: 460 R Axis:   72  Text Interpretation: Sinus tachycardia Nonspecific ST abnormality No significant change since last tracing Confirmed by Cathren Laine ([id number]) on 02/28/2023 4:11:44 PM  Radiology No results found.  Procedures Procedures    Medications Ordered in ED Medications  ondansetron (ZOFRAN-ODT) disintegrating tablet 8 mg (has no administration in time range)    ED Course/ Medical Decision Making/ A&P                                 Medical Decision Making Patient here for evaluation of possible dehydration and repeated vomiting.  States he was vomiting intermittently since waking this morning.  Went to work today and was sweating working out in the heat and believes he may be dehydrated.  Also admits to daily marijuana use.  Denies any dizziness, headache, abdominal or chest pain.  Differential diagnosis would include but not limited to gastroenteritis, cannabinoid hyperemesis, dehydration, electrolyte abnormality,   Amount and/or Complexity of Data Reviewed Labs: ordered.    Details: Labs unremarkable ECG/medicine tests: ordered.    Details: EKG shows sinus tachycardia.  NS ST abnml.  No significant change since last tracing Discussion of management or test interpretation with external provider(s): Discussed findings with the patient.  I have recommended antiemetic and IV fluids.  Patient states that he has to be somewhere at 7:00.  Declines IV and prefers to try oral fluids and oral antiemetic.  On recheck, pt has tolerated oral fluid challenge, reports feeling better after zofan and he is requesting discharge.  I feel this is reasonable.  I suspect cannabinoid hyperemesis.  I have counseled on importance of cessation.  Appears appropriate for d/c home.  Given return precautions.   Risk Prescription drug management.           Final Clinical Impression(s) / ED  Diagnoses Final diagnoses:  Nausea and vomiting, unspecified vomiting type    Rx / DC Orders ED Discharge Orders     None         Rosey Bath 02/28/23 2157    Cathren Laine, MD 03/01/23 [phone]
